# Patient Record
Sex: Male | Born: 1970 | Race: White | Hispanic: No | Marital: Married | Smoking: Never smoker
Health system: Southern US, Community
[De-identification: ages and names within clinical notes are randomized; demographics above are authoritative.]

## PROBLEM LIST (undated history)

## (undated) DIAGNOSIS — E785 Hyperlipidemia, unspecified: Secondary | ICD-10-CM

## (undated) DIAGNOSIS — R3915 Urgency of urination: Secondary | ICD-10-CM

## (undated) DIAGNOSIS — R319 Hematuria, unspecified: Secondary | ICD-10-CM

## (undated) DIAGNOSIS — J302 Other seasonal allergic rhinitis: Secondary | ICD-10-CM

## (undated) DIAGNOSIS — L237 Allergic contact dermatitis due to plants, except food: Secondary | ICD-10-CM

## (undated) DIAGNOSIS — R011 Cardiac murmur, unspecified: Secondary | ICD-10-CM

## (undated) DIAGNOSIS — N201 Calculus of ureter: Secondary | ICD-10-CM

## (undated) DIAGNOSIS — Z87442 Personal history of urinary calculi: Secondary | ICD-10-CM

## (undated) DIAGNOSIS — J45909 Unspecified asthma, uncomplicated: Secondary | ICD-10-CM

## (undated) HISTORY — DX: Hyperlipidemia, unspecified: E78.5

## (undated) HISTORY — DX: Cardiac murmur, unspecified: R01.1

## (undated) HISTORY — DX: Unspecified asthma, uncomplicated: J45.909

## (undated) HISTORY — PX: LAPAROSCOPIC APPENDECTOMY: SUR753

---

## 2010-12-04 HISTORY — PX: APPENDECTOMY: SHX54

## 2011-06-08 ENCOUNTER — Emergency Department (HOSPITAL_COMMUNITY): Payer: BC Managed Care – PPO

## 2011-06-08 ENCOUNTER — Inpatient Hospital Stay (HOSPITAL_COMMUNITY)
Admission: EM | Admit: 2011-06-08 | Discharge: 2011-06-11 | DRG: 580 | Disposition: A | Discharge status: Home / Self Care | Payer: BC Managed Care – PPO | Attending: Surgery | Admitting: Surgery

## 2011-06-08 DIAGNOSIS — K651 Peritoneal abscess: Secondary | ICD-10-CM | POA: Diagnosis present

## 2011-06-08 DIAGNOSIS — T8140XA Infection following a procedure, unspecified, initial encounter: Principal | ICD-10-CM | POA: Diagnosis present

## 2011-06-08 DIAGNOSIS — E7800 Pure hypercholesterolemia, unspecified: Secondary | ICD-10-CM | POA: Diagnosis present

## 2011-06-08 DIAGNOSIS — Y836 Removal of other organ (partial) (total) as the cause of abnormal reaction of the patient, or of later complication, without mention of misadventure at the time of the procedure: Secondary | ICD-10-CM | POA: Diagnosis present

## 2011-06-08 DIAGNOSIS — Z87442 Personal history of urinary calculi: Secondary | ICD-10-CM

## 2011-06-08 LAB — DIFFERENTIAL
Basophils Relative: 0 % (ref 0–1)
Eosinophils Absolute: 0.1 10*3/uL (ref 0.0–0.7)
Monocytes Relative: 8 % (ref 3–12)
Neutrophils Relative %: 83 % — ABNORMAL HIGH (ref 43–77)

## 2011-06-08 LAB — URINALYSIS, ROUTINE W REFLEX MICROSCOPIC
Glucose, UA: NEGATIVE mg/dL
Leukocytes, UA: NEGATIVE
Nitrite: NEGATIVE
Protein, ur: NEGATIVE mg/dL
Urobilinogen, UA: 0.2 mg/dL (ref 0.0–1.0)

## 2011-06-08 MED ORDER — IOHEXOL 300 MG/ML  SOLN
100.0000 mL | Freq: Once | INTRAMUSCULAR | Status: AC | PRN
  Administered 2011-06-08: 100 mL via INTRAVENOUS

## 2011-06-09 ENCOUNTER — Inpatient Hospital Stay (HOSPITAL_COMMUNITY): Payer: BC Managed Care – PPO

## 2011-06-09 LAB — APTT: aPTT: 33 seconds (ref 24–37)

## 2011-06-09 LAB — CBC
MCH: 29 pg (ref 26.0–34.0)
Platelets: 402 10*3/uL — ABNORMAL HIGH (ref 150–400)
Platelets: 332 10*3/uL (ref 150–400)
RBC: 4.35 MIL/uL (ref 4.22–5.81)
RDW: 12.1 % (ref 11.5–15.5)
WBC: 14.7 10*3/uL — ABNORMAL HIGH (ref 4.0–10.5)

## 2011-06-09 LAB — BASIC METABOLIC PANEL
CO2: 26 mEq/L (ref 19–32)
Calcium: 8.9 mg/dL (ref 8.4–10.5)
GFR calc Af Amer: >60 mL/min (ref >60)
GFR calc non Af Amer: >60 mL/min (ref >60)
Sodium: 137 mEq/L (ref 135–145)

## 2011-06-09 LAB — PROTIME-INR
INR: 1.11 (ref 0.00–1.49)
Prothrombin Time: 14.5 seconds (ref 11.6–15.2)

## 2011-06-10 LAB — CBC
MCH: 28.4 pg (ref 26.0–34.0)
MCHC: 33.9 g/dL (ref 30.0–36.0)
RDW: 12 % (ref 11.5–15.5)

## 2011-06-13 LAB — CULTURE, ROUTINE-ABSCESS

## 2011-06-13 NOTE — H&P (Signed)
NAME:  Stephen Dalton, Stephen Dalton NO.:  [id number]  MEDICAL RECORD NO.:  [id number]  LOCATION:  WLED                         FACILITY:  [facility]  PHYSICIAN:  Adolph Pollack, M.D.DATE OF BIRTH:  [birth year]  DATE OF ADMISSION:  06/08/2011 DATE OF DISCHARGE:                             HISTORY & PHYSICAL   REASON FOR ADMISSION:  Post-appendectomy abscesses.  HISTORY:  This is a 40 year old male who recently moved here from [place].  On May 29, 2011, he underwent a laparoscopic appendectomy for what he describes as a gangrenous appendicitis.  He was released the next day.  On his 3rd postoperative day, he began having some fever and malaise.  This progressed through the weekend.  The malaise has progressively worsened along with the persistent fever and he has some right lower quadrant pain.  He presented to the emergency department tonight and was noted to have a leukocytosis.  The CT scan demonstrated multiple abscesses in the intra-abdominal cavity and I was asked to see him for that.  PAST MEDICAL HISTORY: 1. Hypercholesterolemia. 2. Nephrolithiasis.  PREVIOUS OPERATIONS:  Laparoscopic appendectomy.  ALLERGIES:  LATEX.  MEDICATIONS:  Simvastatin.  SOCIAL HISTORY:  He is married with 1 child.  No tobacco or alcohol use.  FAMILY HISTORY:  Noncontributory.  REVIEW OF SYSTEMS:  CARDIAC:  No heart disease, hypertension. PULMONARY:  No asthma, pneumonia, or emphysema.  GI:  No peptic ulcer disease, hepatitis.  ENDOCRINE:  No diabetes.  HEMATOLOGIC:  No bleeding disorders or blood clots.  NEUROLOGIC:  No strokes or seizures.  PHYSICAL EXAMINATION:  GENERAL:  A well-developed, well-nourished male in no acute distress, pleasant, and cooperative. VITAL SIGNS:  Temperature is 100.5, blood pressure is 112/71, pulse 72, respiratory rate 18, O2 sats 95% room air.  NECK:  Supple without masses. HEENT:  Normocephalic, atraumatic. RESPIRATORY:  Breath sounds equal and  clear. RESPIRATIONS:  Unlabored. CARDIOVASCULAR:  Regular rate, regular rhythm.  No murmur.  No JVD. ABDOMEN:  Soft.  There is a small subumbilical scar and 2 small scars in the left lower abdominal area, all clean, dry, and intact.  There is some moderate right lower quadrant tenderness with mild guarding. Hypoactive bowel sounds are noted.  No hernias. MUSCULOSKELETAL:  No edema.  Good range of motion. SKIN:  No jaundice.  LABORATORY DATA:  White cell count 19,900.  Hemoglobin 12.6.  Platelet count 402,000.  Electrolytes within normal limits except for glucose of 102.  Urinalysis negative.  RADIOLOGIC DATA:  CT scan demonstrates some inflammatory changes near the terminal ileum and cecum.  There is about a 2 cm fluid collection in the right lower quadrant and a 2.6 cm fluid collection in the right lower quadrant, which may be connected.  There is a 2.9 cm fluid collection in the left pelvis.  All these are suspicious for abscesses.  IMPRESSION:  Post appendectomy intra-abdominal abscesses.  PLAN:  We will admit to the hospital.  Start broad-spectrum IV antibiotics.  We will obtain the interventional radiology consult tomorrow for consideration of percutaneous drainage.  This is all have been explained to him and he understands.     Adolph Pollack, M.D.     Kari Baars  D:  06/08/2011  T:  06/09/2011  Job:  [id number]  Electronically Signed by Avel Peace M.D. on 06/13/2011 10:14:17 AM

## 2011-06-14 LAB — ANAEROBIC CULTURE

## 2011-06-16 NOTE — Discharge Summary (Signed)
  NAME:  Stephen, Dalton NO.:  [id number]  MEDICAL RECORD NO.:  [id number]  LOCATION:  1321                         FACILITY:  [facility]  PHYSICIAN:  Maisie Fus A. Lenix Benoist, M.D.DATE OF BIRTH:  1971/01/11  DATE OF ADMISSION:  06/08/2011 DATE OF DISCHARGE:  06/11/2011                              DISCHARGE SUMMARY   ADMITTING DIAGNOSIS:  Pelvic abscess, status post laparoscopic appendectomy done in [place].  DISCHARGE DIAGNOSIS:  Pelvic abscess, status post laparoscopic appendectomy done in [place] .  PROCEDURE PERFORMED:  Percutaneous aspiration of intra-abdominal fluid collection by Interventional Radiology.  BRIEF HISTORY:  The patient is a 40 year old male who on May 28, 2010, underwent a laparoscopic appendectomy in [place].  This was for gangrenous appendicitis.  He was discharged home.  He began to develop abdominal pain, but had to re-locate in this region for work.  He was seen on July 5th by Dr. Abbey Chatters.  CT scan showed multiple small pelvic abscesses, he was admitted.  HOSPITAL COURSE:  He underwent percutaneous aspiration on 06/09/2011. His fever went away.  His white count normalized.  Abdominal pain resolved.  Bowels are moving.  He is tolerating regular diet.  He was switched from Zosyn to Augmentin 875 mg p.o. b.i.d., discharged on June 11, 2011.  At that time, he was tolerating regular diet.  He was having bowel movement since abdominal pain was minimal.  He had no fever, no chills, his white count at discharge 7.  Cultures of the fluid at the time of dictation showed no organisms.  DISCHARGE INSTRUCTIONS:  He will follow up in 10 to 14 days in Dr. Janann August' clinic or with Dr. Jamey Ripa.  He will go home on Augmentin 875 mg p.o. b.i.d. for 10 days.  He may return to work later on in this week if he wishes and I have asked him to follow paperwork with the office.  He may drive at his discretion.  He may resume a regular diet.  CONDITION ON  DISCHARGE:  Improved.     Stevon Gough A. Oland Arquette, M.D.     TAC/MEDQ  D:  06/11/2011  T:  06/12/2011  Job:  [id number]  Electronically Signed by Harriette Bouillon M.D. on 06/16/2011 02:55:54 PM

## 2011-06-27 ENCOUNTER — Encounter (INDEPENDENT_AMBULATORY_CARE_PROVIDER_SITE_OTHER): Payer: Self-pay

## 2011-06-27 ENCOUNTER — Ambulatory Visit (INDEPENDENT_AMBULATORY_CARE_PROVIDER_SITE_OTHER): Payer: BC Managed Care – PPO | Admitting: Radiology

## 2011-06-27 DIAGNOSIS — IMO0002 Reserved for concepts with insufficient information to code with codable children: Secondary | ICD-10-CM

## 2011-06-27 DIAGNOSIS — K37 Unspecified appendicitis: Secondary | ICD-10-CM

## 2011-06-27 DIAGNOSIS — K651 Peritoneal abscess: Secondary | ICD-10-CM

## 2011-06-27 NOTE — Progress Notes (Signed)
History of Present Illness: Stephen Dalton is a  40 y.o. male who presents today status post lap appy. He actually had his appendectomy in [place] end of June. He presented to the hospital with pain and fever and was found thave a pelvic abscess. [facility] admitted him and had his abscess percutaneously drained by IR successfully. He was discharge home on antibiotics and has done very well.  The patient is tolerating a regular diet, having normal bowel movements, has good pain control.  He finished his 10 day course of Augmentin.  He is back to most normal activities.   Physical Exam: Abd: soft, nontender, active bowel sounds, nondistended.  All incisions are well healed.  Impression: 1.  Pelvic abscess post appendectomy done at another facility. Percutaneously drained and resolved after antiobiotic course. Plan: He is able to return to normal activities. He  may follow up on a prn basis.

## 2012-07-18 ENCOUNTER — Emergency Department (HOSPITAL_COMMUNITY)
Admission: EM | Admit: 2012-07-18 | Discharge: 2012-07-18 | Disposition: A | Discharge status: Home / Self Care | Payer: BC Managed Care – PPO | Source: Home / Self Care

## 2012-07-18 ENCOUNTER — Encounter (HOSPITAL_COMMUNITY): Payer: Self-pay | Admitting: *Deleted

## 2012-07-18 DIAGNOSIS — M7040 Prepatellar bursitis, unspecified knee: Secondary | ICD-10-CM

## 2012-07-18 MED ORDER — TRAMADOL HCL 50 MG PO TABS: 50.0000 mg | ORAL_TABLET | Freq: Four times a day (QID) | ORAL | Status: AC | PRN

## 2012-07-18 MED ORDER — DOXYCYCLINE HYCLATE 100 MG PO TABS: 100.0000 mg | ORAL_TABLET | Freq: Two times a day (BID) | ORAL | Status: AC

## 2012-07-18 NOTE — ED Provider Notes (Signed)
Medical screening examination/treatment/procedure(s) were performed by a resident physician and as supervising physician I was immediately available for consultation/collaboration.  Leslee Home, M.D.   Reuben Likes, MD 07/18/12 2217

## 2012-07-18 NOTE — ED Provider Notes (Signed)
Stephen Dalton is a 41 y.o. male who presents to Urgent Care today for left anterior knee pain. Patient fell onto his left knee 5 days ago. He suffered an abrasion.  He notes continued pain especially with flexion of the knee. He denies any fevers or chills trouble walking weakness or numbness. Additionally he denies any knee swelling locking or catching.  He has not tried any made pain medicines yet.  Pain improves with rest.     PMH reviewed. Hyperlipidemia History  Substance Use Topics  . Smoking status: Never Smoker   . Smokeless tobacco: Not on file  . Alcohol Use: Yes   works as a Char[employer] fundraiser. It is from [place]  ROS as above Medications reviewed. No current facility-administered medications for this encounter.   Current Outpatient Prescriptions  Medication Sig Dispense Refill  . SIMVASTATIN PO Take by mouth.        . doxycycline (VIBRA-TABS) 100 MG tablet Take 1 tablet (100 mg total) by mouth 2 (two) times daily.  14 tablet  0  . traMADol (ULTRAM) 50 MG tablet Take 1 tablet (50 mg total) by mouth every 6 (six) hours as needed for pain.  30 tablet  0    Exam:  BP 114/78  Pulse 64  Temp 97.9 F (36.6 C) (Oral)  Resp 16  SpO2 98% Gen: Well NAD LEFT KNEE: Abrasion on the anterior medial aspect of the left knee.  Mild surrounding erythema. Mildly tender.  Mild swelling of the prepatellar area.   Palpable squeak overlying the patellar tendon Nontender over the patella itself.  Range of motion 0-120 Negative Lachman's, McMurray's, valgus and varus tests.  Strength intact. Gait normal.   Assessment and Plan: 41 y.o. male with traumatic prepatellar bursitis.   He may have overlying cellulitis. However I do not feel that his prepatellar bursa itself is infected it is not significantly tender there.   Plan: Oral doxycycline and pain control.  Recommend followup with myself or Dr. Farris Has in one week if not improved.  Discussed warning signs or symptoms. Please see discharge  instructions. Patient expresses understanding.      Rodolph Bong, MD 07/18/12 [phone]

## 2012-07-18 NOTE — ED Notes (Signed)
Pt reports pain in left knee after fall last week.

## 2012-12-09 ENCOUNTER — Ambulatory Visit
Admission: RE | Admit: 2012-12-09 | Discharge: 2012-12-09 | Disposition: A | Discharge status: Home / Self Care | Payer: BC Managed Care – PPO | Source: Ambulatory Visit | Attending: Family Medicine | Admitting: Family Medicine

## 2012-12-09 ENCOUNTER — Other Ambulatory Visit: Payer: Self-pay | Admitting: Family Medicine

## 2012-12-09 DIAGNOSIS — R05 Cough: Secondary | ICD-10-CM

## 2012-12-09 DIAGNOSIS — J45909 Unspecified asthma, uncomplicated: Secondary | ICD-10-CM

## 2015-04-29 ENCOUNTER — Emergency Department (INDEPENDENT_AMBULATORY_CARE_PROVIDER_SITE_OTHER): Payer: BLUE CROSS/BLUE SHIELD

## 2015-04-29 ENCOUNTER — Emergency Department (HOSPITAL_COMMUNITY)
Admission: EM | Admit: 2015-04-29 | Discharge: 2015-04-29 | Disposition: A | Discharge status: Home / Self Care | Payer: BLUE CROSS/BLUE SHIELD | Source: Home / Self Care | Attending: Family Medicine | Admitting: Family Medicine

## 2015-04-29 ENCOUNTER — Encounter (HOSPITAL_COMMUNITY): Payer: Self-pay | Admitting: Emergency Medicine

## 2015-04-29 DIAGNOSIS — J40 Bronchitis, not specified as acute or chronic: Secondary | ICD-10-CM

## 2015-04-29 MED ORDER — PREDNISONE 50 MG PO TABS: 50.0000 mg | ORAL_TABLET | Freq: Every day | ORAL | Status: DC

## 2015-04-29 MED ORDER — IPRATROPIUM-ALBUTEROL 0.5-2.5 (3) MG/3ML IN SOLN
RESPIRATORY_TRACT | Status: AC
  Filled 2015-04-29: qty 3

## 2015-04-29 MED ORDER — GUAIFENESIN-CODEINE 100-10 MG/5ML PO SOLN: 5.0000 mL | Freq: Every evening | ORAL | Status: DC | PRN

## 2015-04-29 MED ORDER — IPRATROPIUM-ALBUTEROL 0.5-2.5 (3) MG/3ML IN SOLN
3.0000 mL | Freq: Once | RESPIRATORY_TRACT | Status: AC
  Administered 2015-04-29: 3 mL via RESPIRATORY_TRACT

## 2015-04-29 NOTE — ED Provider Notes (Signed)
Stephen Dalton is a 44 y.o. male who presents to Urgent Care today for cough fatigue headache shortness of breath present for the last week. This is associated with some wheezing. He notes some fevers. No vomiting or diarrhea. He's used some albuterol which helps a little. Feels well otherwise. No significant chest pain. No leg swelling or recent immobility.   History reviewed. No pertinent past medical history. Past Surgical History  Procedure Laterality Date  . Appendectomy     History  Substance Use Topics  . Smoking status: Never Smoker   . Smokeless tobacco: Not on file  . Alcohol Use: Yes   ROS as above Medications: No current facility-administered medications for this encounter.   Current Outpatient Prescriptions  Medication Sig Dispense Refill  . SIMVASTATIN PO Take by mouth.      Marland Kitchen guaiFENesin-codeine 100-10 MG/5ML syrup Take 5 mLs by mouth at bedtime as needed for cough. 120 mL 0  . predniSONE (DELTASONE) 50 MG tablet Take 1 tablet (50 mg total) by mouth daily. 5 tablet 0   No Known Allergies   Exam:  BP 128/76 mmHg  Pulse 104  Temp(Src) 99.6 F (37.6 C) (Oral)  Resp 20  SpO2 95% Gen: Well NAD HEENT: EOMI,  MMM Lungs: Normal work of breathing. Wheezing present bilaterally Heart: Mild tachycardia no MRG Abd: NABS, Soft. Nondistended, Nontender Exts: Brisk capillary refill, warm and well perfused.   Patient was given a 2.5/0.5 mg DuoNeb nebulizer treatment, and felt better  No results found for this or any previous visit (from the past 24 hour(s)). Dg Chest 2 View  04/29/2015   CLINICAL DATA:  Acute cough and shortness of breath and wheezing.  EXAM: CHEST  2 VIEW  COMPARISON:  12/09/2012  FINDINGS: Normal heart size and vascularity. Mild bronchitic and interstitial prominence, nonspecific but can be seen with bronchitis. No focal pneumonia, collapse or consolidation. No edema, effusion or pneumothorax. Trachea midline.  IMPRESSION: Mild bronchitic changes.  No  focal pneumonia.   Electronically Signed   By: Jerilynn Mages.  Shick M.D.   On: 04/29/2015 21:07    Assessment and Plan: 44 y.o. male with bronchitis. Treat with prednisone, albuterol, and codeine cough syrup. Watchful waiting. Return as needed.  Discussed warning signs or symptoms. Please see discharge instructions. Patient expresses understanding.     Gregor Hams, MD 04/29/15 2115

## 2015-04-29 NOTE — ED Notes (Signed)
C/o  Cough and headache.    Pt triaged and assessed by provider.   Provider in before nurse.

## 2015-04-29 NOTE — Discharge Instructions (Signed)
Thank you for coming in today. °Call or go to the emergency room if you get worse, have trouble breathing, have chest pains, or palpitations.  ° °Acute Bronchitis °Bronchitis is inflammation of the airways that extend from the windpipe into the lungs (bronchi). The inflammation often causes mucus to develop. This leads to a cough, which is the most common symptom of bronchitis.  °In acute bronchitis, the condition usually develops suddenly and goes away over time, usually in a couple weeks. Smoking, allergies, and asthma can make bronchitis worse. Repeated episodes of bronchitis may cause further lung problems.  °CAUSES °Acute bronchitis is most often caused by the same virus that causes a cold. The virus can spread from person to person (contagious) through coughing, sneezing, and touching contaminated objects. °SIGNS AND SYMPTOMS  °· Cough.   °· Fever.   °· Coughing up mucus.   °· Body aches.   °· Chest congestion.   °· Chills.   °· Shortness of breath.   °· Sore throat.   °DIAGNOSIS  °Acute bronchitis is usually diagnosed through a physical exam. Your health care provider will also ask you questions about your medical history. Tests, such as chest X-rays, are sometimes done to rule out other conditions.  °TREATMENT  °Acute bronchitis usually goes away in a couple weeks. Oftentimes, no medical treatment is necessary. Medicines are sometimes given for relief of fever or cough. Antibiotic medicines are usually not needed but may be prescribed in certain situations. In some cases, an inhaler may be recommended to help reduce shortness of breath and control the cough. A cool mist vaporizer may also be used to help thin bronchial secretions and make it easier to clear the chest.  °HOME CARE INSTRUCTIONS °· Get plenty of rest.   °· Drink enough fluids to keep your urine clear or pale yellow (unless you have a medical condition that requires fluid restriction). Increasing fluids may help thin your respiratory secretions  (sputum) and reduce chest congestion, and it will prevent dehydration.   °· Take medicines only as directed by your health care provider. °· If you were prescribed an antibiotic medicine, finish it all even if you start to feel better. °· Avoid smoking and secondhand smoke. Exposure to cigarette smoke or irritating chemicals will make bronchitis worse. If you are a smoker, consider using nicotine gum or skin patches to help control withdrawal symptoms. Quitting smoking will help your lungs heal faster.   °· Reduce the chances of another bout of acute bronchitis by washing your hands frequently, avoiding people with cold symptoms, and trying not to touch your hands to your mouth, nose, or eyes.   °· Keep all follow-up visits as directed by your health care provider.   °SEEK MEDICAL CARE IF: °Your symptoms do not improve after 1 week of treatment.  °SEEK IMMEDIATE MEDICAL CARE IF: °· You develop an increased fever or chills.   °· You have chest pain.   °· You have severe shortness of breath. °· You have bloody sputum.   °· You develop dehydration. °· You faint or repeatedly feel like you are going to pass out. °· You develop repeated vomiting. °· You develop a severe headache. °MAKE SURE YOU:  °· Understand these instructions. °· Will watch your condition. °· Will get help right away if you are not doing well or get worse. °Document Released: 12/28/2004 Document Revised: 04/06/2014 Document Reviewed: 05/13/2013 °[employer]® Patient Information ©2015 [employer]. This information is not intended to replace advice given to you by your health care provider. Make sure you discuss any questions you have with your   health care provider. ° °

## 2016-02-03 ENCOUNTER — Emergency Department (HOSPITAL_COMMUNITY)
Admission: EM | Admit: 2016-02-03 | Discharge: 2016-02-04 | Disposition: A | Discharge status: Home / Self Care | Payer: BLUE CROSS/BLUE SHIELD | Attending: Emergency Medicine | Admitting: Emergency Medicine

## 2016-02-03 ENCOUNTER — Encounter (HOSPITAL_COMMUNITY): Payer: Self-pay | Admitting: Emergency Medicine

## 2016-02-03 DIAGNOSIS — N200 Calculus of kidney: Secondary | ICD-10-CM | POA: Diagnosis not present

## 2016-02-03 DIAGNOSIS — R109 Unspecified abdominal pain: Secondary | ICD-10-CM | POA: Diagnosis present

## 2016-02-03 DIAGNOSIS — Z79899 Other long term (current) drug therapy: Secondary | ICD-10-CM | POA: Diagnosis not present

## 2016-02-03 DIAGNOSIS — Z9049 Acquired absence of other specified parts of digestive tract: Secondary | ICD-10-CM | POA: Insufficient documentation

## 2016-02-03 NOTE — ED Notes (Signed)
Patient presents for right flank pain PTA, denies N/V/D, dysuria, frequency or hematuria.

## 2016-02-04 ENCOUNTER — Emergency Department (HOSPITAL_COMMUNITY): Payer: BLUE CROSS/BLUE SHIELD

## 2016-02-04 LAB — COMPREHENSIVE METABOLIC PANEL
ALK PHOS: 63 U/L (ref 38–126)
ALT: 21 U/L (ref 17–63)
AST: 18 U/L (ref 15–41)
Albumin: 5 g/dL (ref 3.5–5.0)
Anion gap: 12 (ref 5–15)
BILIRUBIN TOTAL: 0.6 mg/dL (ref 0.3–1.2)
BUN: 31 mg/dL — AB (ref 6–20)
CALCIUM: 9.7 mg/dL (ref 8.9–10.3)
CHLORIDE: 105 mmol/L (ref 101–111)
CO2: 23 mmol/L (ref 22–32)
CREATININE: 1.52 mg/dL — AB (ref 0.61–1.24)
GFR calc Af Amer: >60 mL/min (ref >60)
GFR, EST NON AFRICAN AMERICAN: 55 mL/min — AB (ref >60)
Glucose, Bld: 164 mg/dL — ABNORMAL HIGH (ref 65–99)
Potassium: 3.7 mmol/L (ref 3.5–5.1)
Sodium: 140 mmol/L (ref 135–145)
Total Protein: 7.8 g/dL (ref 6.5–8.1)

## 2016-02-04 LAB — CBC WITH DIFFERENTIAL/PLATELET
BASOS ABS: 0 10*3/uL (ref 0.0–0.1)
Basophils Relative: 0 %
Eosinophils Absolute: 0 10*3/uL (ref 0.0–0.7)
Eosinophils Relative: 0 %
HEMATOCRIT: 40.3 % (ref 39.0–52.0)
HEMOGLOBIN: 14 g/dL (ref 13.0–17.0)
LYMPHS ABS: 1.5 10*3/uL (ref 0.7–4.0)
LYMPHS PCT: 10 %
MCH: 29.7 pg (ref 26.0–34.0)
MCHC: 34.7 g/dL (ref 30.0–36.0)
MCV: 85.6 fL (ref 78.0–100.0)
Monocytes Absolute: 0.9 10*3/uL (ref 0.1–1.0)
Monocytes Relative: 6 %
NEUTROS ABS: 12.3 10*3/uL — AB (ref 1.7–7.7)
NEUTROS PCT: 84 %
PLATELETS: 278 10*3/uL (ref 150–400)
RBC: 4.71 MIL/uL (ref 4.22–5.81)
RDW: 12.8 % (ref 11.5–15.5)
WBC: 14.7 10*3/uL — AB (ref 4.0–10.5)

## 2016-02-04 LAB — URINALYSIS, ROUTINE W REFLEX MICROSCOPIC
Bilirubin Urine: NEGATIVE
Glucose, UA: NEGATIVE mg/dL
Hgb urine dipstick: NEGATIVE
KETONES UR: NEGATIVE mg/dL
LEUKOCYTES UA: NEGATIVE
NITRITE: NEGATIVE
PH: 5.5 (ref 5.0–8.0)
Protein, ur: NEGATIVE mg/dL
Specific Gravity, Urine: 1.029 (ref 1.005–1.030)

## 2016-02-04 MED ORDER — MORPHINE SULFATE (PF) 4 MG/ML IV SOLN
4.0000 mg | Freq: Once | INTRAVENOUS | Status: AC
  Administered 2016-02-04: 4 mg via INTRAVENOUS
  Filled 2016-02-04: qty 1

## 2016-02-04 MED ORDER — TAMSULOSIN HCL 0.4 MG PO CAPS: 0.4000 mg | ORAL_CAPSULE | Freq: Every day | ORAL | Status: DC

## 2016-02-04 MED ORDER — SODIUM CHLORIDE 0.9 % IV BOLUS (SEPSIS)
1000.0000 mL | Freq: Once | INTRAVENOUS | Status: AC
  Administered 2016-02-04: 1000 mL via INTRAVENOUS

## 2016-02-04 MED ORDER — KETOROLAC TROMETHAMINE 30 MG/ML IJ SOLN
30.0000 mg | Freq: Once | INTRAMUSCULAR | Status: AC
  Administered 2016-02-04: 30 mg via INTRAVENOUS
  Filled 2016-02-04: qty 1

## 2016-02-04 MED ORDER — ONDANSETRON 4 MG PO TBDP: 4.0000 mg | ORAL_TABLET | Freq: Three times a day (TID) | ORAL | Status: DC | PRN

## 2016-02-04 MED ORDER — IBUPROFEN 800 MG PO TABS: 800.0000 mg | ORAL_TABLET | Freq: Three times a day (TID) | ORAL | Status: DC | PRN

## 2016-02-04 MED ORDER — ONDANSETRON HCL 4 MG/2ML IJ SOLN
4.0000 mg | Freq: Once | INTRAMUSCULAR | Status: AC
  Administered 2016-02-04: 4 mg via INTRAVENOUS
  Filled 2016-02-04: qty 2

## 2016-02-04 MED ORDER — OXYCODONE-ACETAMINOPHEN 5-325 MG PO TABS: 1.0000 | ORAL_TABLET | Freq: Four times a day (QID) | ORAL | Status: DC | PRN

## 2016-02-04 NOTE — ED Notes (Signed)
Patient d/c'd self care with wife driving home.  F/U and medications reviewed.  Patient verbalized understanding.

## 2016-02-04 NOTE — ED Provider Notes (Signed)
By signing my name below, I, Altamease Oiler, attest that this documentation has been prepared under the direction and in the presence of [place], DO. Electronically Signed: Altamease Oiler, ED Scribe. 02/04/2016. 1:21 AM.  TIME SEEN: 1:13 AM  CHIEF COMPLAINT: Right flank pain  HPI: Stephen Dalton is a 45 y.o. male with history of kidney stones who presents to the Emergency Department complaining of constant, 8/10 in severity, right flank pain with onset 3 hours ago. The pain radiates to the lower abdomen and is similar to pain that he had in the past with kidney stones. He has the urge to urinate but states that he cannot.  Pt denies testicular pain or swelling, penile discharge, nausea, vomiting, diarrhea, hematuria, dysuria, fever, and chills. NKDA. States his last kidney stone was in 2011 in [place]. He does not have a urologist here. He has never had to have surgery for his kidney stones. He is status post appendectomy.  ROS: See HPI Constitutional: no fever  Eyes: no drainage  ENT: no runny nose   Cardiovascular:  no chest pain  Resp: no SOB  GI: no vomiting GU: no dysuria Integumentary: no rash  Allergy: no hives  Musculoskeletal: no leg swelling  Neurological: no slurred speech ROS otherwise negative  PAST MEDICAL HISTORY/PAST SURGICAL HISTORY:  History reviewed. No pertinent past medical history.  MEDICATIONS:  Prior to Admission medications   Medication Sig Start Date End Date Taking? Authorizing Provider  SIMVASTATIN PO Take 5 mg by mouth daily.    Yes Historical Provider, MD  guaiFENesin-codeine 100-10 MG/5ML syrup Take 5 mLs by mouth at bedtime as needed for cough. Patient not taking: Reported on 02/03/2016 04/29/15   Gregor Hams, MD  predniSONE (DELTASONE) 50 MG tablet Take 1 tablet (50 mg total) by mouth daily. Patient not taking: Reported on 02/03/2016 04/29/15   Gregor Hams, MD    ALLERGIES:  Allergies  Allergen Reactions  . Latex Rash    SOCIAL  HISTORY:  Social History  Substance Use Topics  . Smoking status: Never Smoker   . Smokeless tobacco: Not on file  . Alcohol Use: Yes    FAMILY HISTORY: Family History  Problem Relation Age of Onset  . Heart attack Mother   . Cancer Father     EXAM: BP 127/88 mmHg  Pulse 60  Temp(Src) 98.6 F (37 C) (Oral)  Resp 22  SpO2 100% CONSTITUTIONAL: Alert and oriented and responds appropriately to questions. Appears uncomfortable; well-nourished HEAD: Normocephalic EYES: Conjunctivae clear, PERRL ENT: normal nose; no rhinorrhea; moist mucous membranes; pharynx without lesions noted NECK: Supple, no meningismus, no LAD  CARD: RRR; S1 and S2 appreciated; no murmurs, no clicks, no rubs, no gallops RESP: Normal chest excursion without splinting or tachypnea; breath sounds clear and equal bilaterally; no wheezes, no rhonchi, no rales, no hypoxia or respiratory distress, speaking full sentences ABD/GI: Normal bowel sounds; non-distended; soft, TTP in the RLQ, no rebound, no guarding, no peritoneal signs GU:  Normal external genitalia, circumcised male, normal penile shaft, no blood or discharge at the urethral meatus, no testicular masses or tenderness on exam, no scrotal masses or swelling, no hernias appreciated, 2+ femoral pulses bilaterally; no perineal erythema, warmth, subcutaneous air or crepitus; no high riding testicle, normal bilateral cremasteric reflex BACK:  The back appears normal and is non-tender to palpation, there is no CVA tenderness EXT: Normal ROM in all joints; non-tender to palpation; no edema; normal capillary refill; no cyanosis, no calf tenderness or  swelling    SKIN: Normal color for age and race; warm NEURO: Moves all extremities equally, sensation to light touch intact diffusely, cranial nerves II through XII intact PSYCH: The patient's mood and manner are appropriate. Grooming and personal hygiene are appropriate.  MEDICAL DECISION MAKING: Patient here with right  flank pain that radiates into the right lower quadrant. Suspect kidney stone. Urine shows no blood or sign of infection. Hemodynamically stable. He is status post appendectomy. We'll give IV fluids, Toradol, morphine and Zofran. We'll obtain labs and CT of his abdomen and pelvis.  ED PROGRESS: Patient's labs show mild elevation of his creatinine at 1.52. He has received IV fluids. He does have a leukocytosis with left shift but no sign of urinary tract infection. CT scan shows a 3 mm distal right ureteral stone with mild right hydronephrosis. There is also 7 mm nonobstructing left renal calculus. Patient reports feeling much better. I feel he can be discharged home with urine strainer, Percocet, ibuprofen, Zofran and Flomax. Given outpatient urology follow-up information. Discussed return precautions. He verbalizes understanding and is comfortable with this plan.       I personally performed the services described in this documentation, which was scribed in my presence. The recorded information has been reviewed and is accurate.      [place], DO 02/04/16 [phone]

## 2016-02-04 NOTE — Discharge Instructions (Signed)
Dietary Guidelines to Help Prevent Kidney Stones °Your risk of kidney stones can be decreased by adjusting the foods you eat. The most important thing you can do is drink enough fluid. You should drink enough fluid to keep your urine clear or pale yellow. The following guidelines provide specific information for the type of kidney stone you have had. °GUIDELINES ACCORDING TO TYPE OF KIDNEY STONE °Calcium Oxalate Kidney Stones °· Reduce the amount of salt you eat. Foods that have a lot of salt cause your body to release excess calcium into your urine. The excess calcium can combine with a substance called oxalate to form kidney stones. °· Reduce the amount of animal protein you eat if the amount you eat is excessive. Animal protein causes your body to release excess calcium into your urine. Ask your dietitian how much protein from animal sources you should be eating. °· Avoid foods that are high in oxalates. If you take vitamins, they should have less than 500 mg of vitamin C. Your body turns vitamin C into oxalates. You do not need to avoid fruits and vegetables high in vitamin C. °Calcium Phosphate Kidney Stones °· Reduce the amount of salt you eat to help prevent the release of excess calcium into your urine. °· Reduce the amount of animal protein you eat if the amount you eat is excessive. Animal protein causes your body to release excess calcium into your urine. Ask your dietitian how much protein from animal sources you should be eating. °· Get enough calcium from food or take a calcium supplement (ask your dietitian for recommendations). Food sources of calcium that do not increase your risk of kidney stones include: °· Broccoli. °· Dairy products, such as cheese and yogurt. °· Pudding. °Uric Acid Kidney Stones °· Do not have more than 6 oz of animal protein per day. °FOOD SOURCES °Animal Protein Sources °· Meat (all types). °· Poultry. °· Eggs. °· Fish, seafood. °Foods High in Salt °· Salt seasonings. °· Soy  sauce. °· Teriyaki sauce. °· Cured and processed meats. °· Salted crackers and snack foods. °· Fast food. °· Canned soups and most canned foods. °Foods High in Oxalates °· Grains: °· Amaranth. °· Barley. °· Grits. °· Wheat germ. °· Bran. °· Buckwheat flour. °· All bran cereals. °· Pretzels. °· Whole wheat bread. °· Vegetables: °· Beans (wax). °· Beets and beet greens. °· Collard greens. °· Eggplant. °· Escarole. °· Leeks. °· Okra. °· Parsley. °· Rutabagas. °· Spinach. °· Swiss chard. °· Tomato paste. °· Fried potatoes. °· Sweet potatoes. °· Fruits: °· Red currants. °· Figs. °· Kiwi. °· Rhubarb. °· Meat and Other Protein Sources: °· Beans (dried). °· Soy burgers and other soybean products. °· Miso. °· Nuts (peanuts, almonds, pecans, cashews, hazelnuts). °· Nut butters. °· Sesame seeds and tahini (paste made of sesame seeds). °· Poppy seeds. °· Beverages: °· Chocolate drink mixes. °· Soy milk. °· Instant iced tea. °· Juices made from high-oxalate fruits or vegetables. °· Other: °· Carob. °· Chocolate. °· Fruitcake. °· Marmalades. °  °This information is not intended to replace advice given to you by your health care provider. Make sure you discuss any questions you have with your health care provider. °  °Document Released: 03/17/2011 Document Revised: 11/25/2013 Document Reviewed: 10/17/2013 °Elsevier Interactive Patient Education ©2016 [employer] ° °Kidney Stones °Kidney stones (urolithiasis) are deposits that form inside your kidneys. The intense pain is caused by the stone moving through the urinary tract. When the stone moves, the ureter   goes into spasm around the stone. The stone is usually passed in the urine.  °CAUSES  °· A disorder that makes certain neck glands produce too much parathyroid hormone (primary hyperparathyroidism). °· A buildup of uric acid crystals, similar to gout in your joints. °· Narrowing (stricture) of the ureter. °· A kidney obstruction present at birth (congenital  obstruction). °· Previous surgery on the kidney or ureters. °· Numerous kidney infections. °SYMPTOMS  °· Feeling sick to your stomach (nauseous). °· Throwing up (vomiting). °· Blood in the urine (hematuria). °· Pain that usually spreads (radiates) to the groin. °· Frequency or urgency of urination. °DIAGNOSIS  °· Taking a history and physical exam. °· Blood or urine tests. °· CT scan. °· Occasionally, an examination of the inside of the urinary bladder (cystoscopy) is performed. °TREATMENT  °· Observation. °· Increasing your fluid intake. °· Extracorporeal shock wave lithotripsy--This is a noninvasive procedure that uses shock waves to break up kidney stones. °· Surgery may be needed if you have severe pain or persistent obstruction. There are various surgical procedures. Most of the procedures are performed with the use of small instruments. Only small incisions are needed to accommodate these instruments, so recovery time is minimized. °The size, location, and chemical composition are all important variables that will determine the proper choice of action for you. Talk to your health care provider to better understand your situation so that you will minimize the risk of injury to yourself and your kidney.  °HOME CARE INSTRUCTIONS  °· Drink enough water and fluids to keep your urine clear or pale yellow. This will help you to pass the stone or stone fragments. °· Strain all urine through the provided strainer. Keep all particulate matter and stones for your health care provider to see. The stone causing the pain may be as small as a grain of salt. It is very important to use the strainer each and every time you pass your urine. The collection of your stone will allow your health care provider to analyze it and verify that a stone has actually passed. The stone analysis will often identify what you can do to reduce the incidence of recurrences. °· Only take over-the-counter or prescription medicines for pain,  discomfort, or fever as directed by your health care provider. °· Keep all follow-up visits as told by your health care provider. This is important. °· Get follow-up X-rays if required. The absence of pain does not always mean that the stone has passed. It may have only stopped moving. If the urine remains completely obstructed, it can cause loss of kidney function or even complete destruction of the kidney. It is your responsibility to make sure X-rays and follow-ups are completed. Ultrasounds of the kidney can show blockages and the status of the kidney. Ultrasounds are not associated with any radiation and can be performed easily in a matter of minutes. °· Make changes to your daily diet as told by your health care provider. You may be told to: °¨ Limit the amount of salt that you eat. °¨ Eat 5 or more servings of fruits and vegetables each day. °¨ Limit the amount of meat, poultry, fish, and eggs that you eat. °· Collect a 24-hour urine sample as told by your health care provider. You may need to collect another urine sample every 6-12 months. °SEEK MEDICAL CARE IF: °· You experience pain that is progressive and unresponsive to any pain medicine you have been prescribed. °SEEK IMMEDIATE MEDICAL CARE IF:  °· Pain   Pain cannot be controlled with the prescribed medicine. °· You have a fever or shaking chills. °· The severity or intensity of pain increases over 18 hours and is not relieved by pain medicine. °· You develop a new onset of abdominal pain. °· You feel faint or pass out. °· You are unable to urinate. °  °This information is not intended to replace advice given to you by your health care provider. Make sure you discuss any questions you have with your health care provider. °  °Document Released: 11/20/2005 Document Revised: 08/11/2015 Document Reviewed: 04/23/2013 °Elsevier Interactive Patient Education ©2016 [employer] ° °

## 2016-02-04 NOTE — ED Notes (Signed)
Patient will be d/c'd once ride arrives.

## 2016-02-04 NOTE — ED Notes (Signed)
MD at bedside. 

## 2016-06-07 IMAGING — CT CT RENAL STONE PROTOCOL
2 of 3 series · 16 of 35 positions shown, 18 images · non-contrast
Comparison: CT dated 06/09/2011 and 06/08/2011

CLINICAL DATA: 44-year-old male with right lower quadrant abdominal
pain.

EXAM:
CT ABDOMEN AND PELVIS WITHOUT CONTRAST
TECHNIQUE: Multidetector CT imaging of the abdomen and pelvis was performed
following the standard protocol without IV contrast.

[Series 4: lung · axial · 0.82mm/px · z∈[+1380,+1470]mm · 13 of 21 slices shown, 15 images]
[im 2/21  soft-tissue]
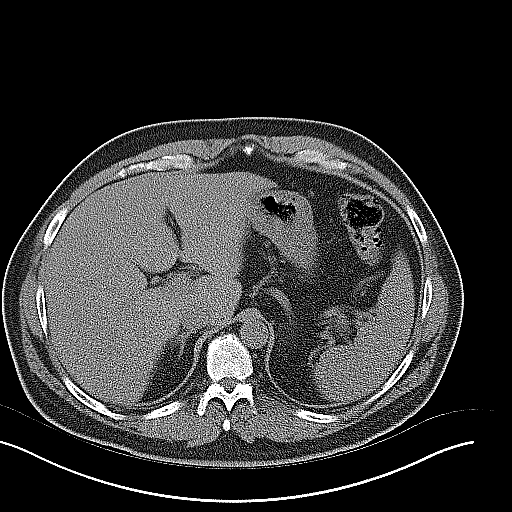
[im 2/21  bone]
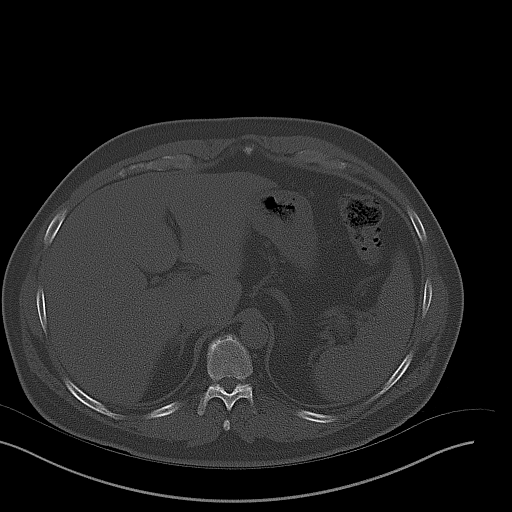
[im 4/21  soft-tissue]
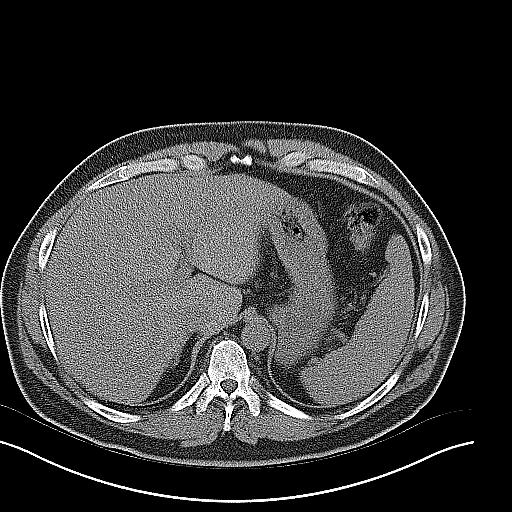
[im 5/21  soft-tissue]
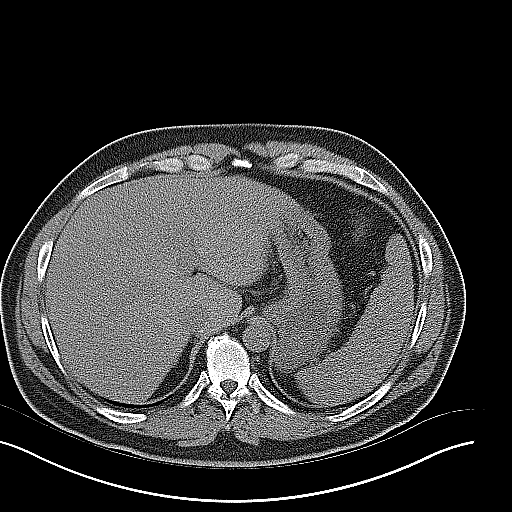
[im 7/21  soft-tissue]
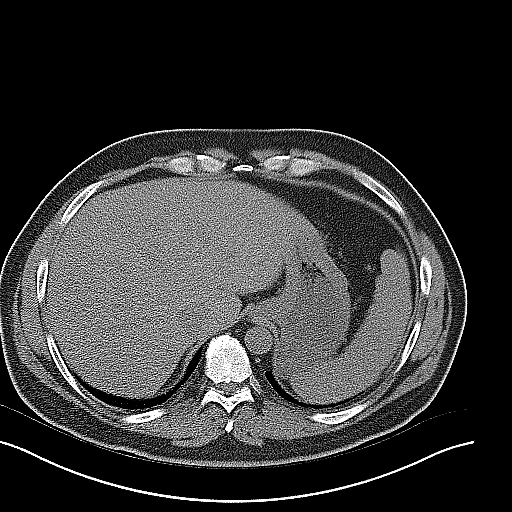
[im 8/21  soft-tissue]
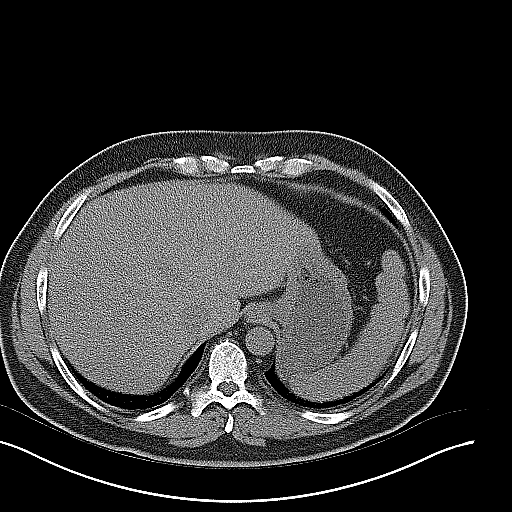
[im 10/21  soft-tissue]
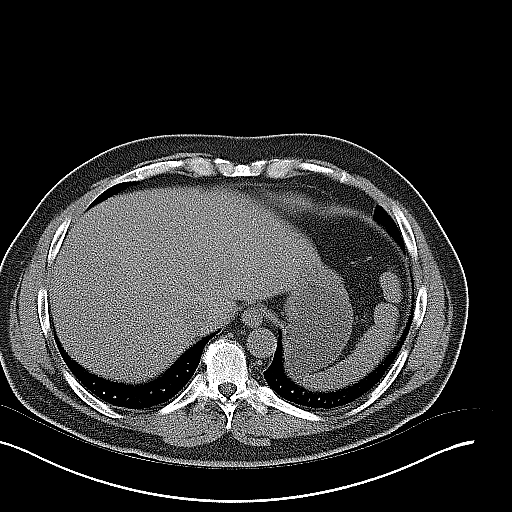
[im 11/21  soft-tissue]
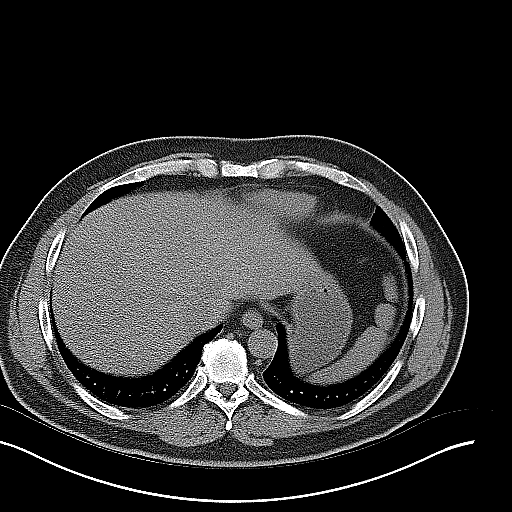
[im 12/21  soft-tissue]
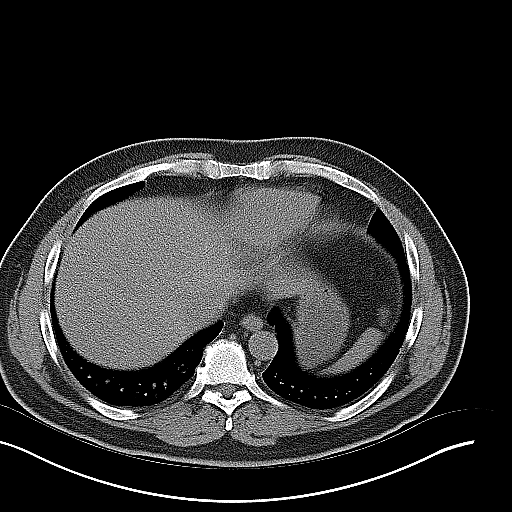
[im 14/21  soft-tissue]
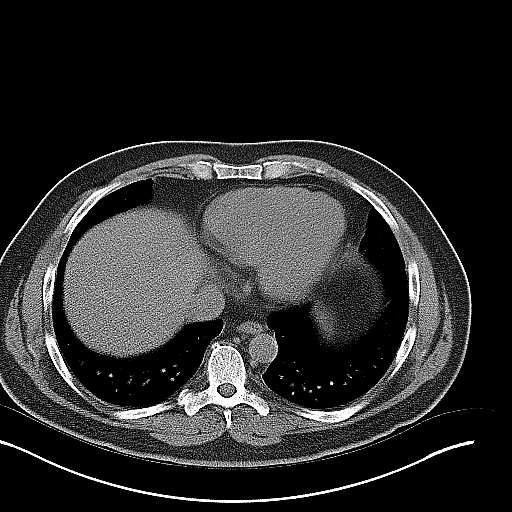
[im 14/21  bone]
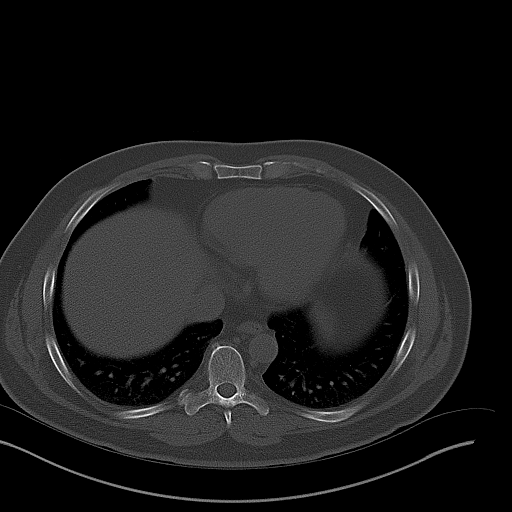
[im 15/21  soft-tissue]
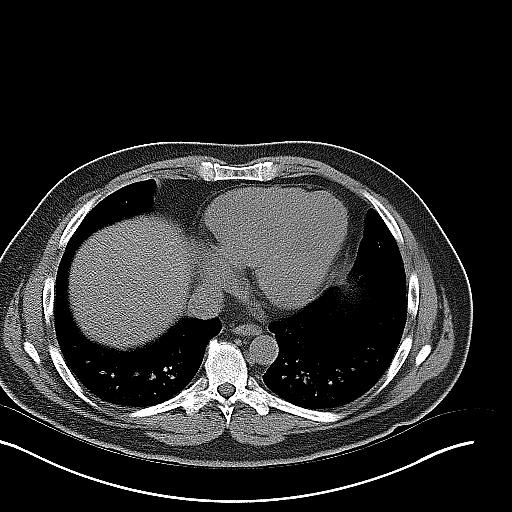
[im 17/21  soft-tissue]
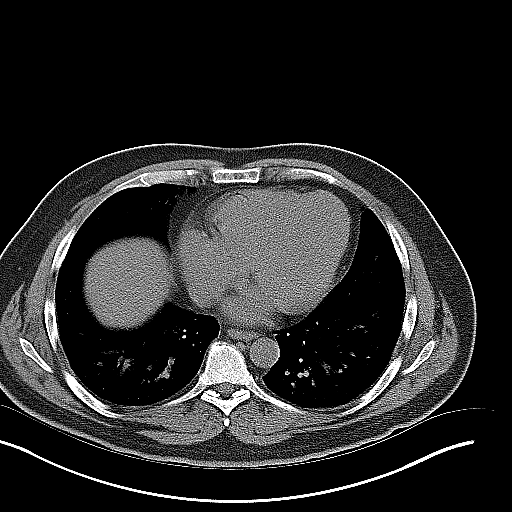
[im 18/21  soft-tissue]
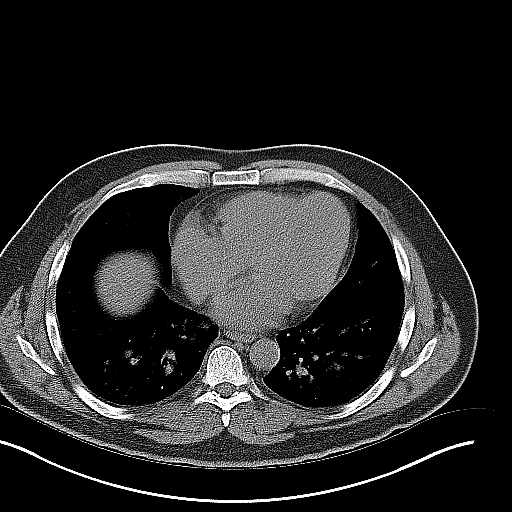
[im 20/21  soft-tissue]
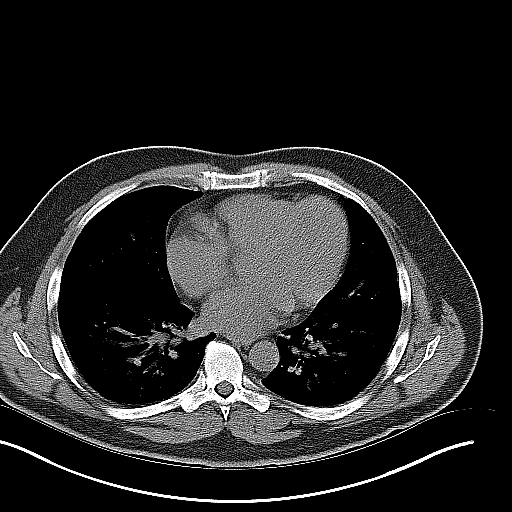

[Series 5: coronal · coronal · 0.74mm/px · 3 of 121 slices shown]
[im 41/121  soft-tissue]
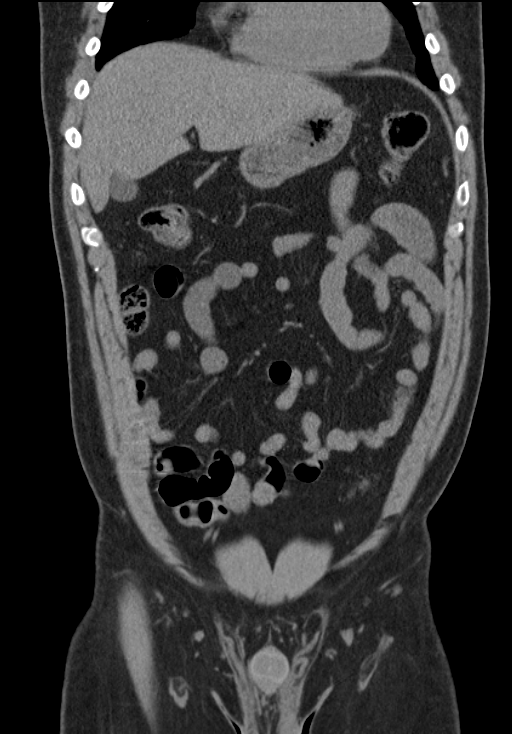
[im 54/121  soft-tissue]
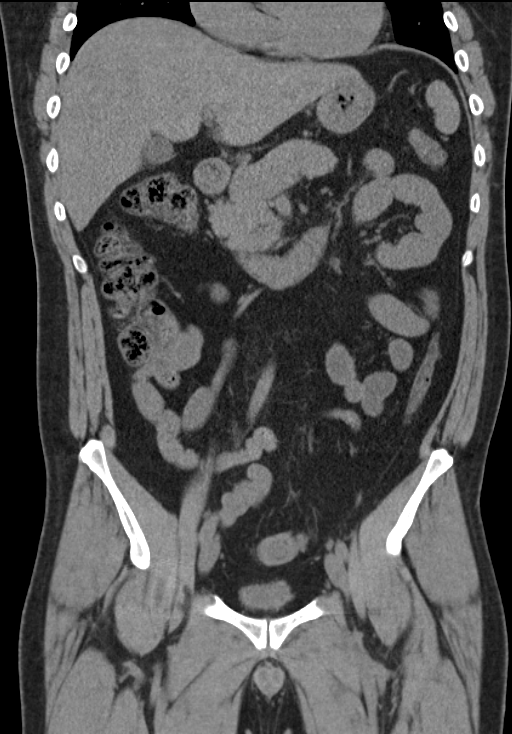
[im 67/121  soft-tissue]
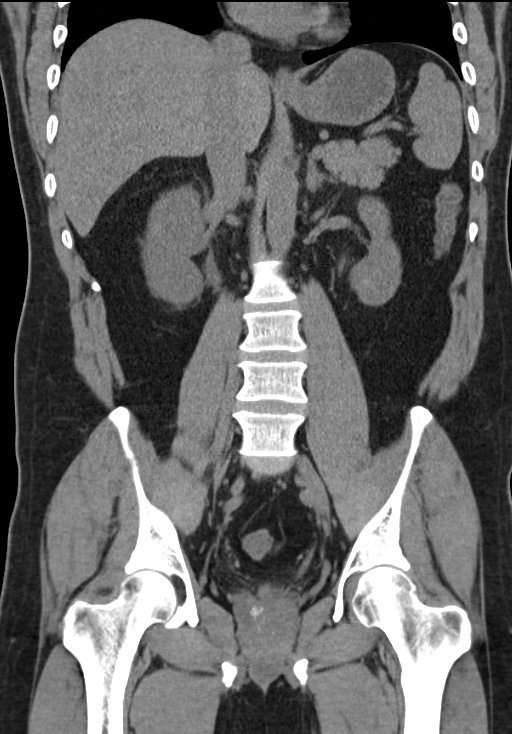

[16 of 35 positions shown; findings below may reference images not displayed]

FINDINGS: Evaluation of this exam is limited in the absence of intravenous
contrast.

The visualized lung bases are clear. No intra-abdominal free air or
free fluid.

The liver, gallbladder, pancreas, spleen, adrenal glands appear
unremarkable. There is a 7 mm nonobstructing stone in the upper pole
of the left kidney. No hydronephrosis. There is a 3 mm distal right
ureteral calculus with mild right hydronephrosis. Correlation with
urinalysis recommended to exclude superimposed UTI. The urinary
bladder is collapsed. The prostate and seminal vesicles are grossly
unremarkable.

No evidence of bowel obstruction or inflammation.  Appendectomy.

The abdominal aorta and IVC appear grossly unremarkable on this
noncontrast study. No portal venous gas identified. There is no
adenopathy. Small fat containing umbilical hernia. The abdominal
wall soft tissues appear unremarkable. The osseous structures are
intact.
IMPRESSION: A 3 mm distal right ureteral stone with mild right hydronephrosis.

A 7 mm nonobstructing left renal calculus. No hydronephrosis on the
left.

## 2016-07-21 ENCOUNTER — Encounter: Payer: Self-pay | Admitting: Physician Assistant

## 2016-07-24 ENCOUNTER — Encounter: Payer: Self-pay | Admitting: Gastroenterology

## 2016-07-27 ENCOUNTER — Ambulatory Visit (INDEPENDENT_AMBULATORY_CARE_PROVIDER_SITE_OTHER): Payer: BLUE CROSS/BLUE SHIELD | Admitting: Physician Assistant

## 2016-07-27 ENCOUNTER — Encounter (INDEPENDENT_AMBULATORY_CARE_PROVIDER_SITE_OTHER): Payer: Self-pay

## 2016-07-27 ENCOUNTER — Encounter: Payer: Self-pay | Admitting: Physician Assistant

## 2016-07-27 VITALS — BP 120/90 | HR 64 | Ht 71.0 in | Wt 211.2 lb

## 2016-07-27 DIAGNOSIS — K649 Unspecified hemorrhoids: Secondary | ICD-10-CM

## 2016-07-27 DIAGNOSIS — K921 Melena: Secondary | ICD-10-CM | POA: Diagnosis not present

## 2016-07-27 DIAGNOSIS — R194 Change in bowel habit: Secondary | ICD-10-CM

## 2016-07-27 DIAGNOSIS — R1032 Left lower quadrant pain: Secondary | ICD-10-CM

## 2016-07-27 MED ORDER — HYDROCORTISONE ACETATE 25 MG RE SUPP: 25.0000 mg | Freq: Two times a day (BID) | RECTAL | 1 refills | Status: DC

## 2016-07-27 NOTE — Progress Notes (Signed)
Agree with assessment and plan. While hemorrhoids are the likely cause, given the patient's age and these symptoms I think a colonoscopy is reasonable at this time to ensure no bleeding polyp / mass lesion if you can assist with having him scheduled. Thanks

## 2016-07-27 NOTE — Patient Instructions (Signed)
We sent a prescription to [address] for the Anusol HC Suppositories. Use 1 twice daily for 7 days, we did give a refill.   We have provided you with a High Fiber Handout.

## 2016-07-27 NOTE — Progress Notes (Signed)
Chief Complaint: Rectal bleeding and change in bowel habits  HPI:  Stephen Dalton is a 45 y/o Caucasian male who was referred to me by Tera Helper, NP for a complaint of rectal bleeding and change in bowel habits .    Today, the patient presents to clinic and explains that for the past "month or more", he has noticed a change in his bowel habits. He describes that he has started to experience a left lower quadrant pressure/discomfort before a bowel movement which is relieved afterwards. He has also noticed that he has been straining more in order to pass a stool. He typically has 2 solid bowel movements in the morning, which have become somewhat softer recently. Patient also notes 2-3 instances of bright red blood on the toilet paper and/or in the toilet bowl. After seeing blood the started to get concerned and then proceeded to his primary care provider as above. Patient does note that he does have some rectal itchiness and discomfort. He did try hydrocortisone cream for this area, with no relief.  Patient does admit to an inadequate water intake on a daily basis which he has been trying to improve recently.  Patient's social history is positive for being from [place]  Patient denies fever, chills, change in diet, recent addition of any medications, recent antibiotics, weight loss, fatigue, anorexia, nausea, vomiting, heartburn, reflux or symptoms that awaken him at night.   Past Medical History:  Diagnosis Date  . Asthma   . HLD (hyperlipidemia)   . Kidney stones    x2    Past Surgical History:  Procedure Laterality Date  . APPENDECTOMY  2012    Current Outpatient Prescriptions  Medication Sig Dispense Refill  . SIMVASTATIN PO Take 5 mg by mouth daily.      No current facility-administered medications for this visit.     Allergies as of 07/27/2016 - Review Complete 07/27/2016  Allergen Reaction Noted  . Latex Rash 02/03/2016    Family History  Problem Relation Age of  Onset  . Heart attack Mother   . Throat cancer Father     Social History   Social History  . Marital status: Married    Spouse name: N/A  . Number of children: N/A  . Years of education: N/A   Occupational History  . Not on file.   Social History Main Topics  . Smoking status: Never Smoker  . Smokeless tobacco: Never Used  . Alcohol use No  . Drug use: No  . Sexual activity: Not on file   Other Topics Concern  . Not on file   Social History Narrative  . No narrative on file    Review of Systems:    Constitutional: No weight loss, fever, chills, weakness or fatigue HEENT: Eyes: No Change in vision               Ears, Nose, Throat:  No change in hearing or congestion Skin: No rash or itching Cardiovascular: No chest pain, chest pressure or palpitations      Respiratory: No SOB or cough Gastrointestinal: See HPI and otherwise negative Genitourinary: No dysuria or change in urinary frequency Neurological: No headache, dizziness or syncope Musculoskeletal: No new muscle or back pain Hematologic: See history of present illness No bruising Psychiatric: No history of depression or anxiety   Physical Exam:  Vital signs: BP 120/90 (BP Location: Left Arm, Patient Position: Sitting, Cuff Size: Normal)   Pulse 64   Ht 5\' 11"  (1.803  m)   Wt 211 lb 3.2 oz (95.8 kg)   BMI 29.46 kg/m   General:   Pleasant Caucasian male appears to be in NAD, Well developed, Well nourished, alert and cooperative Head:  Normocephalic and atraumatic. Eyes:   PEERL, EOMI. No icterus. Conjunctiva pink. Ears:  Normal auditory acuity. Neck:  Supple Throat: Oral cavity and pharynx without inflammation, swelling or lesion. Teeth in good condition. Lungs: Respirations even and unlabored. Lungs clear to auscultation bilaterally.   No wheezes, crackles, or rhonchi.  Heart: Normal S1, S2. No MRG. Regular rate and rhythm. No peripheral edema, cyanosis or pallor.  Abdomen:  Soft, nondistended, nontender.  No rebound or guarding. Normal bowel sounds. No appreciable masses or hepatomegaly. Rectal:  External: No hemorrhoids or fissure identified, internal: Rectal fullness and some pressure with palpation, no residue  Msk:  Symmetrical without gross deformities. Peripheral pulses intact.  Extremities:  Without edema, no deformity or joint abnormality. Normal ROM Neurologic:  Alert and  oriented x4;  grossly normal neurologically. Skin:   Dry and intact without significant lesions or rashes. Psychiatric: Oriented to person, place and time. Demonstrates good judgement and reason without abnormal affect or behaviors.  RELEVANT LABS AND IMAGING: No recent labs or imaging  Assessment: 1. Change in bowel habits: Patient describes recent increase in straining and abdominal pain prior to a bowel movement, relieved afterwards, this has changed over the past few months, also with 2-3 episodes of a small amount of bright red blood; likely this represents constipation with corresponding hemorrhoids and some bleeding, though if no improvement from conservative measures, cannot rule out colon cancer without colonoscopy 2. Left lower quadrant abdominal discomfort: Likely due to constipation 3. Hematochezia: 2-3 episodes over the past few months, small amounts of bright red blood, none in the past 2 weeks, likely related to above 4. Hemorrhoids: Appreciated at time of rectal exam today  Plan: 1. Recommend the patient do a trial of conservative measures. Discussed that he should increase his fiber intake to at least 25-35 g per day with the use of fiber supplement such as Metamucil, Citrucel or Benefiber. 2. Recommend the patient increase his water intake to at least 6-8 8 ounce glasses of water per day 3. Recommend the patient maintain adequate exercise to help avoid constipation 4. Prescribe Anusol suppositories 25 mg, twice a day 7 days with one refill 5. If patient continues with rectal itching he can try  Preparation H cream over-the-counter 6. Did discuss with the patient that if he has an increase or worsening of symptoms over the next 2 weeks, he can call our clinic and we will schedule him for a colonoscopy. This would be with Dr. Havery Moros as the patient is establishing care here today and he is the supervising physician.  Ellouise Newer, PA-C [facility] 07/27/2016, 1:06 PM  Cc: Tera Helper, NP

## 2016-07-28 ENCOUNTER — Telehealth: Payer: Self-pay | Admitting: Physician Assistant

## 2016-07-28 NOTE — Telephone Encounter (Signed)
Called pharmacy and spoke to Rodman Key, Software engineer.  The patient said his insurance wont cover the suppositories.  I had him change it to the cream, 30 grams with 1 refill.

## 2017-03-09 ENCOUNTER — Ambulatory Visit (HOSPITAL_BASED_OUTPATIENT_CLINIC_OR_DEPARTMENT_OTHER): Payer: BLUE CROSS/BLUE SHIELD | Admitting: Anesthesiology

## 2017-03-09 ENCOUNTER — Other Ambulatory Visit: Payer: Self-pay | Admitting: Urology

## 2017-03-09 ENCOUNTER — Encounter (HOSPITAL_BASED_OUTPATIENT_CLINIC_OR_DEPARTMENT_OTHER)
Admission: RE | Disposition: A | Discharge status: Home / Self Care | Payer: Self-pay | Source: Ambulatory Visit | Attending: Urology

## 2017-03-09 ENCOUNTER — Ambulatory Visit (HOSPITAL_BASED_OUTPATIENT_CLINIC_OR_DEPARTMENT_OTHER)
Admission: RE | Admit: 2017-03-09 | Discharge: 2017-03-09 | Disposition: A | Discharge status: Home / Self Care | Payer: BLUE CROSS/BLUE SHIELD | Source: Ambulatory Visit | Attending: Urology | Admitting: Urology

## 2017-03-09 ENCOUNTER — Encounter (HOSPITAL_BASED_OUTPATIENT_CLINIC_OR_DEPARTMENT_OTHER): Payer: Self-pay | Admitting: *Deleted

## 2017-03-09 DIAGNOSIS — N200 Calculus of kidney: Secondary | ICD-10-CM | POA: Diagnosis not present

## 2017-03-09 DIAGNOSIS — J45909 Unspecified asthma, uncomplicated: Secondary | ICD-10-CM | POA: Diagnosis not present

## 2017-03-09 DIAGNOSIS — Z79899 Other long term (current) drug therapy: Secondary | ICD-10-CM | POA: Insufficient documentation

## 2017-03-09 DIAGNOSIS — Z87442 Personal history of urinary calculi: Secondary | ICD-10-CM | POA: Diagnosis not present

## 2017-03-09 DIAGNOSIS — E785 Hyperlipidemia, unspecified: Secondary | ICD-10-CM | POA: Diagnosis not present

## 2017-03-09 HISTORY — DX: Hematuria, unspecified: R31.9

## 2017-03-09 HISTORY — DX: Urgency of urination: R39.15

## 2017-03-09 HISTORY — DX: Personal history of urinary calculi: Z87.442

## 2017-03-09 HISTORY — PX: CYSTOSCOPY WITH RETROGRADE PYELOGRAM, URETEROSCOPY AND STENT PLACEMENT: SHX5789

## 2017-03-09 HISTORY — DX: Other seasonal allergic rhinitis: J30.2

## 2017-03-09 HISTORY — DX: Calculus of ureter: N20.1

## 2017-03-09 HISTORY — DX: Allergic contact dermatitis due to plants, except food: L23.7

## 2017-03-09 LAB — POCT HEMOGLOBIN-HEMACUE: Hemoglobin: 14.2 g/dL (ref 13.0–17.0)

## 2017-03-09 SURGERY — CYSTOURETEROSCOPY, WITH RETROGRADE PYELOGRAM AND STENT INSERTION
Anesthesia: General | Laterality: Left

## 2017-03-09 MED ORDER — MIDAZOLAM HCL 2 MG/2ML IJ SOLN
INTRAMUSCULAR | Status: AC
  Filled 2017-03-09: qty 2

## 2017-03-09 MED ORDER — FENTANYL CITRATE (PF) 100 MCG/2ML IJ SOLN
INTRAMUSCULAR | Status: DC | PRN
Start: 2017-03-09 — End: 2017-03-09
  Administered 2017-03-09 (×2): 50 ug via INTRAVENOUS

## 2017-03-09 MED ORDER — PHENAZOPYRIDINE HCL 200 MG PO TABS: 200.0000 mg | ORAL_TABLET | Freq: Three times a day (TID) | ORAL | 0 refills | Status: AC | PRN

## 2017-03-09 MED ORDER — DEXAMETHASONE SODIUM PHOSPHATE 10 MG/ML IJ SOLN
INTRAMUSCULAR | Status: DC | PRN
  Administered 2017-03-09: 10 mg via INTRAVENOUS

## 2017-03-09 MED ORDER — ONDANSETRON HCL 4 MG/2ML IJ SOLN
INTRAMUSCULAR | Status: DC | PRN
  Administered 2017-03-09: 4 mg via INTRAVENOUS

## 2017-03-09 MED ORDER — CEFAZOLIN SODIUM-DEXTROSE 2-4 GM/100ML-% IV SOLN
2.0000 g | INTRAVENOUS | Status: AC
  Administered 2017-03-09: 2 g via INTRAVENOUS
  Filled 2017-03-09: qty 100

## 2017-03-09 MED ORDER — IOHEXOL 300 MG/ML  SOLN
INTRAMUSCULAR | Status: DC | PRN
Start: 2017-03-09 — End: 2017-03-09
  Administered 2017-03-09: 4 mL via INTRAVENOUS

## 2017-03-09 MED ORDER — METOCLOPRAMIDE HCL 5 MG/ML IJ SOLN
INTRAMUSCULAR | Status: DC | PRN
  Administered 2017-03-09: 10 mg via INTRAVENOUS

## 2017-03-09 MED ORDER — WHITE PETROLATUM GEL
Status: AC
  Filled 2017-03-09: qty 5

## 2017-03-09 MED ORDER — PROPOFOL 10 MG/ML IV BOLUS
INTRAVENOUS | Status: AC
  Filled 2017-03-09: qty 20

## 2017-03-09 MED ORDER — MIDAZOLAM HCL 5 MG/5ML IJ SOLN
INTRAMUSCULAR | Status: DC | PRN
  Administered 2017-03-09: 2 mg via INTRAVENOUS

## 2017-03-09 MED ORDER — KETOROLAC TROMETHAMINE 15 MG/ML IJ SOLN
30.0000 mg | Freq: Four times a day (QID) | INTRAMUSCULAR | Status: DC | PRN
  Administered 2017-03-09: 30 mg via INTRAVENOUS
  Filled 2017-03-09: qty 2

## 2017-03-09 MED ORDER — FENTANYL CITRATE (PF) 100 MCG/2ML IJ SOLN
INTRAMUSCULAR | Status: AC
  Filled 2017-03-09: qty 2

## 2017-03-09 MED ORDER — OXYCODONE-ACETAMINOPHEN 5-325 MG PO TABS: 1.0000 | ORAL_TABLET | ORAL | 0 refills | Status: DC | PRN

## 2017-03-09 MED ORDER — DEXAMETHASONE SODIUM PHOSPHATE 10 MG/ML IJ SOLN
INTRAMUSCULAR | Status: AC
  Filled 2017-03-09: qty 1

## 2017-03-09 MED ORDER — EPHEDRINE 5 MG/ML INJ
INTRAVENOUS | Status: AC
  Filled 2017-03-09: qty 10

## 2017-03-09 MED ORDER — LACTATED RINGERS IV SOLN
INTRAVENOUS | Status: DC
  Administered 2017-03-09: 13:00:00 via INTRAVENOUS
  Filled 2017-03-09: qty 1000

## 2017-03-09 MED ORDER — ONDANSETRON HCL 4 MG/2ML IJ SOLN
INTRAMUSCULAR | Status: AC
  Filled 2017-03-09: qty 2

## 2017-03-09 MED ORDER — LIDOCAINE 2% (20 MG/ML) 5 ML SYRINGE
INTRAMUSCULAR | Status: DC | PRN
  Administered 2017-03-09: 100 mg via INTRAVENOUS

## 2017-03-09 MED ORDER — PROPOFOL 10 MG/ML IV BOLUS
INTRAVENOUS | Status: DC | PRN
  Administered 2017-03-09: 200 mg via INTRAVENOUS

## 2017-03-09 SURGICAL SUPPLY — 35 items
BAG DRAIN URO-CYSTO SKYTR STRL (DRAIN) ×3 IMPLANT
BASKET LASER NITINOL 1.9FR (BASKET) IMPLANT
BASKET ZERO TIP NITINOL 2.4FR (BASKET) IMPLANT
CATH URET 5FR 28IN CONE TIP (BALLOONS)
CATH URET 5FR 28IN OPEN ENDED (CATHETERS) IMPLANT
CATH URET 5FR 70CM CONE TIP (BALLOONS) IMPLANT
CLOTH BEACON ORANGE TIMEOUT ST (SAFETY) ×3 IMPLANT
ELECT REM PT RETURN 9FT ADLT (ELECTROSURGICAL)
ELECTRODE REM PT RTRN 9FT ADLT (ELECTROSURGICAL) IMPLANT
EXTRACTOR STONE NITINOL NGAGE (UROLOGICAL SUPPLIES) ×3 IMPLANT
FIBER LASER FLEXIVA 1000 (UROLOGICAL SUPPLIES) IMPLANT
FIBER LASER FLEXIVA 365 (UROLOGICAL SUPPLIES) IMPLANT
FIBER LASER FLEXIVA 550 (UROLOGICAL SUPPLIES) IMPLANT
FIBER LASER TRAC TIP (UROLOGICAL SUPPLIES) ×3 IMPLANT
GLOVE SURG SS PI 8.0 STRL IVOR (GLOVE) ×3 IMPLANT
GOWN STRL REUS W/ TWL LRG LVL3 (GOWN DISPOSABLE) IMPLANT
GOWN STRL REUS W/ TWL XL LVL3 (GOWN DISPOSABLE) ×1 IMPLANT
GOWN STRL REUS W/TWL LRG LVL3 (GOWN DISPOSABLE)
GOWN STRL REUS W/TWL XL LVL3 (GOWN DISPOSABLE) ×2
GUIDEWIRE 0.038 PTFE COATED (WIRE) IMPLANT
GUIDEWIRE ANG ZIPWIRE 038X150 (WIRE) IMPLANT
GUIDEWIRE STR DUAL SENSOR (WIRE) ×3 IMPLANT
IV NS IRRIG 3000ML ARTHROMATIC (IV SOLUTION) ×3 IMPLANT
KIT BALLIN UROMAX 15FX10 (LABEL) IMPLANT
KIT BALLN UROMAX 15FX4 (MISCELLANEOUS) IMPLANT
KIT BALLN UROMAX 26 75X4 (MISCELLANEOUS)
KIT RM TURNOVER CYSTO AR (KITS) ×3 IMPLANT
MANIFOLD NEPTUNE II (INSTRUMENTS) IMPLANT
PACK CYSTO (CUSTOM PROCEDURE TRAY) ×3 IMPLANT
SET HIGH PRES BAL DIL (LABEL)
SHEATH ACCESS URETERAL 38CM (SHEATH) IMPLANT
SHEATH ACCESS URETERAL 54CM (SHEATH) ×3 IMPLANT
STENT URET 6FRX26 CONTOUR (STENTS) ×3 IMPLANT
TUBE CONNECTING 12'X1/4 (SUCTIONS)
TUBE CONNECTING 12X1/4 (SUCTIONS) IMPLANT

## 2017-03-09 NOTE — H&P (Signed)
CC: I have kidney stones.  HPI: Stephen Dalton is a 46 year-old male established patient who is here for renal calculi.  Acute renal colic beginning this morning radiating from flank to the left lower abdomen. Also associated with nausea. Patient has a known left upper pole calculi last evaluated with imaging in September 2017. Denies any significant bothersome lower urinary tract symptoms. He is afebrile.   The problem is on the left side. He first stated noticing pain on 03/09/2017. This is not his first kidney stone. He is currently having flank pain and nausea. He denies having back pain, groin pain, vomiting, fever, and chills. He has not caught a stone in his urine strainer since his symptoms began.     ALLERGIES: Latex    MEDICATIONS: Simvastatin 80 mg tablet Oral     GU PSH: None     PSH Notes: Appendectomy   NON-GU PSH: Appendectomy - 02/10/2016    GU PMH: Personal Hx urinary calculi - 08/16/2016 Calculus Ureter, Ureteral calculus, right - 02/10/2016 Kidney Stone, Renal calculus, left - 02/10/2016      PMH Notes: Calculus disease: He was seen in the ER on 02/04/16 A CT scan was obtained which revealed no right renal calculi, a 7 mm stone in the upper pole of the left kidney with Hounsfield units of 900 and a 3 mm stone in the distal right ureter which he passed.   NON-GU PMH: Encounter for general adult medical examination without abnormal findings, Encounter for preventive health examination - 02/10/2016 Personal history of other diseases of the circulatory system, History of cardiac murmur - 02/10/2016 Personal history of other diseases of the respiratory system, History of asthma - 02/10/2016 Personal history of other endocrine, nutritional and metabolic disease, History of hyperlipidemia - 02/10/2016    FAMILY HISTORY: Myocardial Infarction - Runs In Family   SOCIAL HISTORY: Marital Status: Married Current Smoking Status: Patient has never smoked.  Light Drinker.  Drinks 3  caffeinated drinks per day.     Notes: Occupation, Alcohol use, Married, Number of children, Never a smoker, Caffeine use   REVIEW OF SYSTEMS:    GU Review Male:   Patient denies frequent urination, hard to postpone urination, burning/ pain with urination, get up at night to urinate, leakage of urine, stream starts and stops, trouble starting your stream, have to strain to urinate , erection problems, and penile pain.  Gastrointestinal (Upper):   Patient reports nausea. Patient denies vomiting and indigestion/ heartburn.  Gastrointestinal (Lower):   Patient denies diarrhea and constipation.  Constitutional:   Patient denies fever, night sweats, weight loss, and fatigue.  Skin:   Patient denies skin rash/ lesion and itching.  Eyes:   Patient denies blurred vision and double vision.  Ears/ Nose/ Throat:   Patient denies sore throat and sinus problems.  Hematologic/Lymphatic:   Patient denies swollen glands and easy bruising.  Cardiovascular:   Patient denies leg swelling and chest pains.  Respiratory:   Patient denies cough and shortness of breath.  Endocrine:   Patient denies excessive thirst.  Musculoskeletal:   Patient reports back pain. Patient denies joint pain.  Neurological:   Patient denies dizziness and headaches.  Psychologic:   Patient denies depression and anxiety.   VITAL SIGNS:      03/09/2017 09:50 AM  Weight 200 lb / 90.72 kg  Height 71 in / 180.34 cm  BP 156/107 mmHg  Pulse 47 /min  Temperature 97.7 F / 36 C  BMI 27.9 kg/m  MULTI-SYSTEM PHYSICAL EXAMINATION:    Constitutional: Well-nourished. No physical deformities. Normally developed. Good grooming.  Neck: Neck symmetrical, not swollen. Normal tracheal position.  Respiratory: No labored breathing, no use of accessory muscles. CTA  Cardiovascular: Normal temperature, normal extremity pulses, no swelling, no varicosities. RRR w/o murmur  Lymphatic: No enlargement of neck, axillae, groin.  Skin: No paleness, no  jaundice, no cyanosis. No lesion, no ulcer, no rash.  Neurologic / Psychiatric: Oriented to time, oriented to place, oriented to person. No depression, no anxiety, no agitation.  Gastrointestinal: No mass, no tenderness, no rigidity, non obese abdomen. Left flank tenderness.  Eyes: Normal conjunctivae. Normal eyelids.  Ears, Nose, Mouth, and Throat: Left ear no scars, no lesions, no masses. Right ear no scars, no lesions, no masses. Nose no scars, no lesions, no masses. Normal hearing. Normal lips.  Musculoskeletal: Normal gait and station of head and neck.     PAST DATA REVIEWED:  Source Of History:  Patient   PROCEDURES:         C.T. Urogram - [id number]  Left Kidney/Ureter:  5-7 mm UPJ stone. Moderate ureteral dilation. Normal left kidney. Minimal hydro but situation is evolving.               Urinalysis w/Scope Dipstick Dipstick Cont'd Micro  Color: Amber Bilirubin: Neg WBC/hpf: NS (Not Seen)  Appearance: Cloudy Ketones: Neg RBC/hpf: >60/hpf  Specific Gravity: 1.025 Blood: 3+ Bacteria: Few (10-25/hpf)  pH: 6.0 Protein: Trace Cystals: Amorph Urates  Glucose: Neg Urobilinogen: 0.2 Casts: NS (Not Seen)    Nitrites: Neg Trichomonas: Not Present    Leukocyte Esterase: Neg Mucous: Present      Epithelial Cells: NS (Not Seen)      Yeast: NS (Not Seen)      Sperm: Not Present         Ketoralac 60mg  - [id number], [id number] Skin was prepped with alcohol. Site was injected. Pt tolerated well.   Qty: 60 Adm. By: Providence Lanius  Unit: mg Lot No [id number]  Route: IM Exp. Date 08/04/2018  Freq: None Mfgr.:   Site: Left Buttock         Morphine 4mg  - J2270, [id number] Qty: 4 Adm. By: Providence Lanius  Unit: mg Lot No [id number]  Route: IM Exp. Date 06/03/2018  Freq: None Mfgr.:   Site: Left Buttock         Phenergan 25mg  - [id number], J2550 Qty: 25 Adm. By: Providence Lanius  Unit: mg Lot No [id number]  Route: IM Exp. Date 03/04/2018  Freq: None Mfgr.:   Site: Right Buttock   ASSESSMENT:      ICD-10 Details  1  GU:   Kidney Stone - N20.0 Left, lt prox ureter  2   Renal Colic - D22 Acute   PLAN:            Medications New Meds: Tamsulosin Hcl 0.4 mg capsule, ext release 24 hr 1 capsule PO Daily   #30  3 Refill(s)  Ondansetron Odt 4 mg tablet,disintegrating 1 odt PO q 6-8 prn for nausea   #30  1 Refill(s)  Oxycodone Hcl 10 mg tablet 1/2-1 tablet PO q4-6h prn   #20  0 Refill(s)            Orders Labs Urine Culture  X-Rays: C.T. Stone Protocol Without Contrast  X-Ray Notes: History:  Hematuria: Yes/No  Patient to see MD after exam: Yes/No  Previous exam: CT / IVP/ US/ KUB/ None  When:  Where:  Diabetic: Yes/ No  BUN/ Creatinine:  Date of last BUN Creatinine:  Weight in pounds:  Allergy- IV Contrast: Yes/ No  Conflicting diabetic meds: Yes/ No  Diabetic Meds:  Prior Authorization #: [id number]           Schedule Return Visit/Planned Activity: ASAP - Schedule Surgery  Procedure: 03/09/2017 at [facility] - [phone] - Morphine 4mg  (Ther/Proph/Diag Inj, [place]/Im) - 28118, A6773  Procedure: 03/09/2017 at [facility] - [place] 60mg  (Toradol Per 15 Mg) - [id number], [id number]  Procedure: 03/09/2017 at [facility] - [place] 25mg  (Phenergan Per 50 Mg) - P3668, [id number]          Document Letter(s):  Created for Patient: Clinical Summary         Notes:   Left proximal ureteral calculi confirmed on CT. I discussed with the on-call urologist. Patient failed 2 attempts at pain management with morphine and Toradol. He still having intermittent nausea and severe renal colic. Plan is to take patient for ureteroscopy tonight or if unable to visualize stone, a ureteral stent will be placed with plan for repeat ureteroscopy at a later date or lithotripsy.  The procedure as well as risks and complications were discussed with the patient. These include but are not limited to infection, injury to the urinary tract i.e. ureteral  disruption/evulsion, bleeding, stent discomfort, anesthetic complications, among others.

## 2017-03-09 NOTE — Anesthesia Procedure Notes (Signed)
Procedure Name: LMA Insertion Date/Time: 03/09/2017 1:58 PM Performed by: Lyndle Herrlich Pre-anesthesia Checklist: Patient identified, Emergency Drugs available, Suction available and Patient being monitored Patient Re-evaluated:Patient Re-evaluated prior to inductionOxygen Delivery Method: Circle system utilized Preoxygenation: Pre-oxygenation with 100% oxygen Intubation Type: IV induction Ventilation: Mask ventilation without difficulty LMA: LMA inserted LMA Size: 4.0 Number of attempts: 1 Airway Equipment and Method: Bite block Placement Confirmation: positive ETCO2 Tube secured with: Tape Dental Injury: Teeth and Oropharynx as per pre-operative assessment

## 2017-03-09 NOTE — Discharge Instructions (Signed)
Post Anesthesia Home Care Instructions  Activity: Get plenty of rest for the remainder of the day. A responsible individual must stay with you for 24 hours following the procedure.  For the next 24 hours, DO NOT: -Drive a car -Paediatric nurse -Drink alcoholic beverages -Take any medication unless instructed by your physician -Make any legal decisions or sign important papers.  Meals: Start with liquid foods such as gelatin or soup. Progress to regular foods as tolerated. Avoid greasy, spicy, heavy foods. If nausea and/or vomiting occur, drink only clear liquids until the nausea and/or vomiting subsides. Call your physician if vomiting continues.  Special Instructions/Symptoms: Your throat may feel dry or sore from the anesthesia or the breathing tube placed in your throat during surgery. If this causes discomfort, gargle with warm salt water. The discomfort should disappear within 24 hours.  If you had a scopolamine patch placed behind your ear for the management of post- operative nausea and/or vomiting:  1. The medication in the patch is effective for 72 hours, after which it should be removed.  Wrap patch in a tissue and discard in the trash. Wash hands thoroughly with soap and water. 2. You may remove the patch earlier than 72 hours if you experience unpleasant side effects which may include dry mouth, dizziness or visual disturbances. 3. Avoid touching the patch. Wash your hands with soap and water after contact with the patch.   Ureteral Stent Implantation, Care After Refer to this sheet in the next few weeks. These instructions provide you with information about caring for yourself after your procedure. Your health care provider may also give you more specific instructions. Your treatment has been planned according to current medical practices, but problems sometimes occur. Call your health care provider if you have any problems or questions after your procedure. What can I expect  after the procedure? After the procedure, it is common to have:  Nausea.  Mild pain when you urinate. You may feel this pain in your lower back or lower abdomen. Pain should stop within a few minutes after you urinate. This may last for up to 1 week.  A small amount of blood in your urine for several days. Follow these instructions at home:   Medicines   Take over-the-counter and prescription medicines only as told by your health care provider.  If you were prescribed an antibiotic medicine, take it as told by your health care provider. Do not stop taking the antibiotic even if you start to feel better.  Do not drive for 24 hours if you received a sedative.  Do not drive or operate heavy machinery while taking prescription pain medicines. Activity   Return to your normal activities as told by your health care provider. Ask your health care provider what activities are safe for you.  Do not lift anything that is heavier than 10 lb (4.5 kg). Follow this limit for 1 week after your procedure, or for as long as told by your health care provider. General instructions   Watch for any blood in your urine. Call your health care provider if the amount of blood in your urine increases.  If you have a catheter:  Follow instructions from your health care provider about taking care of your catheter and collection bag.  Do not take baths, swim, or use a hot tub until your health care provider approves.  Drink enough fluid to keep your urine clear or pale yellow.  Keep all follow-up visits as told by your  health care provider. This is important. Contact a health care provider if:  You have pain that gets worse or does not get better with medicine, especially pain when you urinate.  You have difficulty urinating.  You feel nauseous or you vomit repeatedly during a period of more than 2 days after the procedure. Get help right away if:  Your urine is dark red or has blood clots in  it.  You are leaking urine (have incontinence).  The end of the stent comes out of your urethra.  You cannot urinate.  You have sudden, sharp, or severe pain in your abdomen or lower back.  You have a fever.  We will need to remove this in the office in a week.  This information is not intended to replace advice given to you by your health care provider. Make sure you discuss any questions you have with your health care provider. Document Released: 07/23/2013 Document Revised: 04/27/2016 Document Reviewed: 06/04/2015 Elsevier Interactive Patient Education  2017 [employer].

## 2017-03-09 NOTE — Anesthesia Preprocedure Evaluation (Signed)
Anesthesia Evaluation  Patient identified by MRN, date of birth, ID band Patient awake    Reviewed: Allergy & Precautions, H&P , Patient's Chart, lab work & pertinent test results, reviewed documented beta blocker date and time   Airway Mallampati: II  TM Distance: >3 FB Neck ROM: full    Dental no notable dental hx.    Pulmonary asthma ,    Pulmonary exam normal breath sounds clear to auscultation       Cardiovascular  Rhythm:regular Rate:Normal     Neuro/Psych    GI/Hepatic   Endo/Other    Renal/GU      Musculoskeletal   Abdominal   Peds  Hematology   Anesthesia Other Findings   Reproductive/Obstetrics                             Anesthesia Physical Anesthesia Plan  ASA: II  Anesthesia Plan: General   Post-op Pain Management:    Induction: Intravenous  Airway Management Planned: LMA  Additional Equipment:   Intra-op Plan:   Post-operative Plan:   Informed Consent: I have reviewed the patients History and Physical, chart, labs and discussed the procedure including the risks, benefits and alternatives for the proposed anesthesia with the patient or authorized representative who has indicated his/her understanding and acceptance.   Dental Advisory Given  Plan Discussed with: CRNA and Surgeon  Anesthesia Plan Comments: ( )        Anesthesia Quick Evaluation

## 2017-03-09 NOTE — Brief Op Note (Signed)
03/09/2017  4:05 PM  PATIENT:  Stephen Dalton  46 y.o. male  PRE-OPERATIVE DIAGNOSIS:  left ureteral stone  POST-OPERATIVE DIAGNOSIS: left renal stone  PROCEDURE:  Procedure(s): CYSTOSCOPY WITH LEFT RETROGRADE PYELOGRAM, LEFT URETEROSCOPY, HOLMIUM LASER AND STENT PLACEMENT (Left)  SURGEON:  Surgeon(s) and Role:    * Irine Seal, MD - Primary  PHYSICIAN ASSISTANT:   ASSISTANTS: none   ANESTHESIA:   general  EBL:  Total I/O In: 1300 [P.O.:200; I.V.:1100] Out: 155 [Urine:150; Blood:5]  BLOOD ADMINISTERED:none  DRAINS: 6 x 26 left JJ stent without string   LOCAL MEDICATIONS USED:  NONE  SPECIMEN:  Source of Specimen:  stone fragments  DISPOSITION OF SPECIMEN:  to patient to bring to office.  COUNTS:  YES  TOURNIQUET:  * No tourniquets in log *  DICTATION: .Other Dictation: Dictation Number [id number]  PLAN OF CARE: Discharge to home after PACU  PATIENT DISPOSITION:  PACU - hemodynamically stable.   Delay start of Pharmacological VTE agent (>24hrs) due to surgical blood loss or risk of bleeding: not applicable

## 2017-03-09 NOTE — Transfer of Care (Signed)
  Last Vitals:  Vitals:   03/09/17 1247  BP: (!) 129/91  Pulse: 65  Resp: 16  Temp: 36.6 C    Last Pain:  Vitals:   03/09/17 1313  TempSrc:   PainSc: 5       Patients Stated Pain Goal: 5 (03/09/17 1313) Immediate Anesthesia Transfer of Care Note  Patient: Berline Lopes  Procedure(s) Performed: Procedure(s) (LRB): CYSTOSCOPY WITH LEFT RETROGRADE PYELOGRAM, LEFT URETEROSCOPY, HOLMIUM LASER AND STENT PLACEMENT (Left)  Patient Location: PACU  Anesthesia Type: General  Level of Consciousness: awake, alert  and oriented  Airway & Oxygen Therapy: Patient Spontanous Breathing and Patient connected to nasal cannula oxygen  Post-op Assessment: Report given to PACU RN and Post -op Vital signs reviewed and stable  Post vital signs: Reviewed and stable  Complications: No apparent anesthesia complications

## 2017-03-12 ENCOUNTER — Encounter (HOSPITAL_BASED_OUTPATIENT_CLINIC_OR_DEPARTMENT_OTHER): Payer: Self-pay | Admitting: Urology

## 2017-03-12 NOTE — Op Note (Signed)
NAME:  Stephen Dalton, Stephen Dalton                   ACCOUNT NO.:  MEDICAL RECORD NO.:  [id number]  LOCATION:                                 FACILITY:  PHYSICIAN:  Marshall Cork. Jeffie Pollock, M.D.    DATE OF BIRTH:  02-09-1971  DATE OF PROCEDURE:  03/09/2017 DATE OF DISCHARGE:                              OPERATIVE REPORT   PROCEDURE:  Cystoscopy with left retrograde pyelogram and interpretation, left ureteroscopy with holmium lasertripsy and placement of left double-J stent.  PREOPERATIVE DIAGNOSIS:  A 7 mm left ureteropelvic junction stone.  POSTOPERATIVE DIAGNOSIS:  A 7 mm left renal stone.  SURGEON:  Marshall Cork. Jeffie Pollock, M.D.  ANESTHESIA:  General.  SPECIMEN:  Stone fragments.  DRAINS:  A 6-French 26 cm double-J stent without string.  BLOOD LOSS:  Minimal.  COMPLICATIONS:  None.  INDICATIONS:  Mr. Revolorio is a 46 year old white male who presented today with severe left flank pain that could not be controlled as he has a 7 mm stone at the left UPJ on CT.  It was felt that cystoscopy with stenting and possible ureteroscopy were indicated.  FINDINGS OF PROCEDURE:  He was given 2 g of Ancef.  He was taken to the operating room where general anesthetic was induced.  He was placed in lithotomy position and was fitted with PAS hose.  His perineum and genitalia were prepped with Betadine solution.  He was draped in usual sterile fashion.  A cystoscopy was performed using a 23-French scope and 30-degree lens. Examination revealed a normal urethra.  The external sphincter was intact.  The prostatic urethra was short without obstruction. Examination of the bladder revealed mild trabeculation.  No tumors, stones, or inflammation were noted.  The ureteral orifices were in the normal anatomic position.  Left ureteral orifice was cannulated with 5-French open-end catheter and contrast was instilled.  A left retrograde pyelogram revealed some fusiform dilation of the distal intramural ureter consistent  with a small ureterocele.  The ureter was otherwise normal caliber to the kidney.  There was a filling defect at the UPJ that flushed back to the kidney consistent with a stone with mild dilation.  A guidewire was then passed to the kidney and a 12, 14, 55 cm digital access sheath were then initially used to dilate the ureter with the inner core.  There were some resistance in the lower mid ureter, but eventually was able to get up to the kidney.  The sheath was then assembled and inserted once again with resistance in the same location, but passage to the kidney was possible.  The inner core and wire were then removed.  The dual-lumen digital flexible scope was then passed and the stone was visualized in the mid kidney.  The stone was then engaged with a 200-micron holmium laser fiber initially on 1 watt and 5 hertz, but the frequency was increased to 20 hertz after initial fragmentation.  The stone was broken into manageable fragments which were then removed with the N-gage basket.  Once all the fragments were removed, final inspection of all the calices revealed no residual significant stones.  At this point, the guidewire was replaced and the  ureteroscope was removed under direct vision.  There was a small mucosal tear in the mid ureteral area that was not full thickness, but it was felt a stent was indicated.  Once the sheath and ureteroscope were out, a cystoscope was reinserted over the wire and a 6-French 26 cm Contour double-J stent without string was passed to the kidney under fluoroscopic guidance.  The wire was removed leaving good coil in the kidney and good coil in the bladder. We will leave that in for at least a week.  The patient was then taken down from lithotomy position.  His anesthetic was reversed.  He was moved to the recovery room in stable condition. There were no complications.     Marshall Cork. Jeffie Pollock, M.D.     JJW/MEDQ  D:  03/09/2017  T:   03/09/2017  Job:  [id number]

## 2017-03-13 NOTE — Anesthesia Postprocedure Evaluation (Signed)
Anesthesia Post Note  Patient: Stephen Dalton  Procedure(s) Performed: Procedure(s) (LRB): CYSTOSCOPY WITH LEFT RETROGRADE PYELOGRAM, LEFT URETEROSCOPY, HOLMIUM LASER AND STENT PLACEMENT (Left)  Patient location during evaluation: PACU Anesthesia Type: General Level of consciousness: awake and alert Pain management: pain level controlled Vital Signs Assessment: post-procedure vital signs reviewed and stable Respiratory status: spontaneous breathing, nonlabored ventilation, respiratory function stable and patient connected to nasal cannula oxygen Cardiovascular status: blood pressure returned to baseline and stable Postop Assessment: no signs of nausea or vomiting Anesthetic complications: no       Last Vitals:  Vitals:   03/09/17 1515 03/09/17 1535  BP: 121/88 118/74  Pulse: (!) 59 61  Resp: 15 15  Temp:  36.6 C    Last Pain:  Vitals:   03/12/17 1455  TempSrc:   PainSc: 0-No pain                 Frona Yost EDWARD

## 2017-06-15 NOTE — Addendum Note (Signed)
Addendum  created 06/15/17 1100 by Lyndle Herrlich, MD   Sign clinical note

## 2017-06-15 NOTE — Anesthesia Postprocedure Evaluation (Signed)
Anesthesia Post Note  Patient: Arwin Bisceglia  Procedure(s) Performed: Procedure(s) (LRB): CYSTOSCOPY WITH LEFT RETROGRADE PYELOGRAM, LEFT URETEROSCOPY, HOLMIUM LASER AND STENT PLACEMENT (Left)     Anesthesia Post Evaluation  Last Vitals:  Vitals:   03/09/17 1515 03/09/17 1535  BP: 121/88 118/74  Pulse: (!) 59 61  Resp: 15 15  Temp:  36.6 C    Last Pain:  Vitals:   03/12/17 1455  TempSrc:   PainSc: 0-No pain                 Arek Spadafore EDWARD

## 2018-05-24 ENCOUNTER — Other Ambulatory Visit: Payer: Self-pay | Admitting: Family Medicine

## 2018-05-24 ENCOUNTER — Ambulatory Visit
Admission: RE | Admit: 2018-05-24 | Discharge: 2018-05-24 | Disposition: A | Discharge status: Home / Self Care | Payer: BLUE CROSS/BLUE SHIELD | Source: Ambulatory Visit | Attending: Family Medicine | Admitting: Family Medicine

## 2018-05-24 DIAGNOSIS — R053 Chronic cough: Secondary | ICD-10-CM

## 2018-05-24 DIAGNOSIS — R05 Cough: Secondary | ICD-10-CM

## 2022-05-16 ENCOUNTER — Encounter: Payer: Self-pay | Admitting: Gastroenterology

## 2022-05-23 ENCOUNTER — Ambulatory Visit (AMBULATORY_SURGERY_CENTER): Payer: BLUE CROSS/BLUE SHIELD | Admitting: *Deleted

## 2022-05-23 VITALS — Ht 71.0 in | Wt 230.0 lb

## 2022-05-23 DIAGNOSIS — Z1211 Encounter for screening for malignant neoplasm of colon: Secondary | ICD-10-CM

## 2022-05-23 NOTE — Progress Notes (Signed)
No egg or soy allergy known to patient  No issues known to pt with past sedation with any surgeries or procedures Patient denies ever being told they had issues or difficulty with intubation  No FH of Malignant Hyperthermia Pt is not on diet pills Pt is not on  home 02  Pt is not on blood thinners  Pt denies issues with constipation  No A fib or A flutter  PV completed in person. Pt verified name, DOB.  Procedure explained to pt. Prep instructions reviewed, questions answered. Pt encouraged to call with questions or issues.  If pt has My chart, procedure instructions sent via My Chart    

## 2022-06-15 ENCOUNTER — Encounter: Payer: Self-pay | Admitting: Gastroenterology

## 2022-06-19 ENCOUNTER — Encounter: Payer: Self-pay | Admitting: Gastroenterology

## 2022-06-19 ENCOUNTER — Ambulatory Visit (AMBULATORY_SURGERY_CENTER): Payer: 59 | Admitting: Gastroenterology

## 2022-06-19 VITALS — BP 120/80 | HR 71 | Temp 99.3°F | Resp 13 | Ht 71.0 in | Wt 230.0 lb

## 2022-06-19 DIAGNOSIS — K621 Rectal polyp: Secondary | ICD-10-CM

## 2022-06-19 DIAGNOSIS — Z1211 Encounter for screening for malignant neoplasm of colon: Secondary | ICD-10-CM

## 2022-06-19 DIAGNOSIS — D128 Benign neoplasm of rectum: Secondary | ICD-10-CM

## 2022-06-19 MED ORDER — SODIUM CHLORIDE 0.9 % IV SOLN: 500.0000 mL | INTRAVENOUS | Status: DC

## 2022-06-19 NOTE — Progress Notes (Signed)
[facility] History and Physical   Primary Care Physician:  [name], MD   Reason for Procedure:   Colon cancer screening  Plan:    colonoscopy     HPI: Stephen Dalton is a 51 y.o. male  here for colonoscopy screening - first time exam. Patient denies any bowel symptoms at this time. No family history of colon cancer known. Otherwise feels well without any cardiopulmonary symptoms.    Past Medical History:  Diagnosis Date   Asthma    Heart murmur    Hematuria    History of kidney stones    HLD (hyperlipidemia)    Left ureteral stone    Poison ivy    since 03-05-2017--- legs and arm   Seasonal allergies    Urgency of urination     Past Surgical History:  Procedure Laterality Date   APPENDECTOMY  2012   CYSTOSCOPY WITH RETROGRADE PYELOGRAM, URETEROSCOPY AND STENT PLACEMENT Left 03/09/2017   Procedure: CYSTOSCOPY WITH LEFT RETROGRADE PYELOGRAM, LEFT URETEROSCOPY, HOLMIUM LASER AND STENT PLACEMENT;  Surgeon: Irine Seal, MD;  Location: [facility];  Service: Urology;  Laterality: Left;   LAPAROSCOPIC APPENDECTOMY  05-29-2011   in [place]    Prior to Admission medications   Medication Sig Start Date End Date Taking? Authorizing Provider  FLUoxetine (PROZAC) 10 MG capsule Take 10 mg by mouth daily. 01/23/22  Yes [provider]  simvastatin (ZOCOR) 10 MG tablet Take 10 mg by mouth daily.   Yes [provider]  ALBUTEROL IN Inhale into the lungs as needed.    [provider]  ibuprofen (ADVIL,MOTRIN) 200 MG tablet Take 200 mg by mouth every 6 (six) hours as needed.    [provider]  Multiple Vitamin (MULTIVITAMIN) tablet Take 1 tablet by mouth daily. Patient not taking: Reported on 05/23/2022    [provider]  oxyCODONE-acetaminophen (ROXICET) 5-325 MG tablet Take 1 tablet by mouth every 4 (four) hours as needed for severe pain. Patient not taking: Reported on 05/23/2022 03/09/17   Irine Seal, MD   phenazopyridine (PYRIDIUM) 200 MG tablet Take 1 tablet (200 mg total) by mouth 3 (three) times daily as needed for pain. Patient not taking: Reported on 05/23/2022 03/09/17   Irine Seal, MD    Current Outpatient Medications  Medication Sig Dispense Refill   FLUoxetine (PROZAC) 10 MG capsule Take 10 mg by mouth daily.     simvastatin (ZOCOR) 10 MG tablet Take 10 mg by mouth daily.     ALBUTEROL IN Inhale into the lungs as needed.     ibuprofen (ADVIL,MOTRIN) 200 MG tablet Take 200 mg by mouth every 6 (six) hours as needed.     Multiple Vitamin (MULTIVITAMIN) tablet Take 1 tablet by mouth daily. (Patient not taking: Reported on 05/23/2022)     oxyCODONE-acetaminophen (ROXICET) 5-325 MG tablet Take 1 tablet by mouth every 4 (four) hours as needed for severe pain. (Patient not taking: Reported on 05/23/2022) 20 tablet 0   phenazopyridine (PYRIDIUM) 200 MG tablet Take 1 tablet (200 mg total) by mouth 3 (three) times daily as needed for pain. (Patient not taking: Reported on 05/23/2022) 15 tablet 0   Current Facility-Administered Medications  Medication Dose Route Frequency Provider Last Rate Last Admin   0.9 %  sodium chloride infusion  500 mL Intravenous Continuous Kenden Brandt, Carlota Raspberry, MD        Allergies as of 06/19/2022 - Review Complete 06/19/2022  Allergen Reaction Noted   Latex Rash 02/03/2016  Family History  Problem Relation Age of Onset   Heart attack Mother    Throat cancer Father    Colon cancer Neg Hx    Colon polyps Neg Hx    Esophageal cancer Neg Hx    Stomach cancer Neg Hx    Rectal cancer Neg Hx     Social History   Socioeconomic History   Marital status: Married    Spouse name: Not on file   Number of children: Not on file   Years of education: Not on file   Highest education level: Not on file  Occupational History   Not on file  Tobacco Use   Smoking status: Never   Smokeless tobacco: Never  Vaping Use   Vaping Use: Never used  Substance and Sexual  Activity   Alcohol use: Yes    Comment: rare   Drug use: No   Sexual activity: Not on file  Other Topics Concern   Not on file  Social History Narrative   Not on file   Social Determinants of Health   Financial Resource Strain: Not on file  Food Insecurity: Not on file  Transportation Needs: Not on file  Physical Activity: Not on file  Stress: Not on file  Social Connections: Not on file  Intimate Partner Violence: Not on file    Review of Systems: All other review of systems negative except as mentioned in the HPI.  Physical Exam: Vital signs BP (!) 152/104   Pulse 100   Temp 99.3 F (37.4 C) (Temporal)   Ht '5\' 11"'$  (1.803 m)   Wt 230 lb (104.3 kg)   SpO2 97%   BMI 32.08 kg/m   General:   Alert,  Well-developed, pleasant and cooperative in NAD Lungs:  Clear throughout to auscultation.   Heart:  Regular rate and rhythm Abdomen:  Soft, nontender and nondistended.   Neuro/Psych:  Alert and cooperative. Normal mood and affect. A and O x 3  Jolly Mango, MD [facility]

## 2022-06-19 NOTE — Op Note (Signed)
[facility] Patient Name: Stephen Dalton Procedure Date: 06/19/2022 4:03 PM MRN: [id number] Endoscopist: Remo Lipps P. Havery Moros , MD Age: 51 Referring MD:  Date of Birth: [birth year] Gender: Male Account #: [id number] Procedure:                Colonoscopy Indications:              Screening for colorectal malignant neoplasm, This                            is the patient's first colonoscopy Medicines:                Monitored Anesthesia Care Procedure:                Pre-Anesthesia Assessment:                           - Prior to the procedure, a History and Physical                            was performed, and patient medications and                            allergies were reviewed. The patient's tolerance of                            previous anesthesia was also reviewed. The risks                            and benefits of the procedure and the sedation                            options and risks were discussed with the patient.                            All questions were answered, and informed consent                            was obtained. Prior Anticoagulants: The patient has                            taken no previous anticoagulant or antiplatelet                            agents. ASA Grade Assessment: II - A patient with                            mild systemic disease. After reviewing the risks                            and benefits, the patient was deemed in                            satisfactory condition to undergo the procedure.  After obtaining informed consent, the colonoscope                            was passed under direct vision. Throughout the                            procedure, the patient's blood pressure, pulse, and                            oxygen saturations were monitored continuously. The                            CF [id number] #[id number] was introduced through the anus                            and advanced to the  the cecum, identified by                            appendiceal orifice and ileocecal valve. The                            colonoscopy was performed without difficulty. The                            patient tolerated the procedure well. The quality                            of the bowel preparation was good. The ileocecal                            valve, appendiceal orifice, and rectum were                            photographed. Scope In: 4:18:35 PM Scope Out: 4:33:11 PM Scope Withdrawal Time: 0 hours 12 minutes 44 seconds  Total Procedure Duration: 0 hours 14 minutes 36 seconds  Findings:                 The perianal and digital rectal examinations were                            normal.                           A 3 mm polyp was found in the rectum. The polyp was                            sessile. The polyp was removed with a cold snare.                            Resection and retrieval were complete.                           Multiple small-mouthed diverticula were found in  the sigmoid colon.                           Internal hemorrhoids were found.                           The exam was otherwise without abnormality. Complications:            No immediate complications. Estimated blood loss:                            Minimal. Estimated Blood Loss:     Estimated blood loss was minimal. Impression:               - One 3 mm polyp in the rectum, removed with a cold                            snare. Resected and retrieved.                           - Diverticulosis in the sigmoid colon.                           - Internal hemorrhoids.                           - The examination was otherwise normal. Recommendation:           - Patient has a contact number available for                            emergencies. The signs and symptoms of potential                            delayed complications were discussed with the                             patient. Return to normal activities tomorrow.                            Written discharge instructions were provided to the                            patient.                           - Resume previous diet.                           - Continue present medications.                           - Await pathology results. Remo Lipps P. [name], MD 06/19/2022 4:37:01 PM This report has been signed electronically.

## 2022-06-19 NOTE — Progress Notes (Signed)
Pt's states no medical or surgical changes since previsit or office visit. 

## 2022-06-19 NOTE — Patient Instructions (Signed)
  HANDOUTS ON DIVERTICULOSIS, POLYPS AND HEMORRHOIDS GIVEN.   YOU HAD AN ENDOSCOPIC PROCEDURE TODAY AT [facility]:   Refer to the procedure report that was given to you for any specific questions about what was found during the examination.  If the procedure report does not answer your questions, please call your gastroenterologist to clarify.  If you requested that your care partner not be given the details of your procedure findings, then the procedure report has been included in a sealed envelope for you to review at your convenience later.  YOU SHOULD EXPECT: Some feelings of bloating in the abdomen. Passage of more gas than usual.  Walking can help get rid of the air that was put into your GI tract during the procedure and reduce the bloating. If you had a lower endoscopy (such as a colonoscopy or flexible sigmoidoscopy) you may notice spotting of blood in your stool or on the toilet paper. If you underwent a bowel prep for your procedure, you may not have a normal bowel movement for a few days.  Please Note:  You might notice some irritation and congestion in your nose or some drainage.  This is from the oxygen used during your procedure.  There is no need for concern and it should clear up in a day or so.  SYMPTOMS TO REPORT IMMEDIATELY:  Following lower endoscopy (colonoscopy or flexible sigmoidoscopy):  Excessive amounts of blood in the stool  Significant tenderness or worsening of abdominal pains  Swelling of the abdomen that is new, acute  Fever of 100F or higher   For urgent or emergent issues, a gastroenterologist can be reached at any hour by calling [phone]. Do not use MyChart messaging for urgent concerns.    DIET:  We do recommend a small meal at first, but then you may proceed to your regular diet.  Drink plenty of fluids but you should avoid alcoholic beverages for 24 hours.  ACTIVITY:  You should plan to take it easy for the rest of today and you  should NOT DRIVE or use heavy machinery until tomorrow (because of the sedation medicines used during the test).    FOLLOW UP: Our staff will call the number listed on your records the next business day following your procedure.  We will call around 7:15- 8:00 am to check on you and address any questions or concerns that you may have regarding the information given to you following your procedure. If we do not reach you, we will leave a message.  If you develop any symptoms (ie: fever, flu-like symptoms, shortness of breath, cough etc.) before then, please call [phone].  If you test positive for Covid 19 in the 2 weeks post procedure, please call and report this information to [place].    If any biopsies were taken you will be contacted by phone or by letter within the next 1-3 weeks.  Please call us at [phone] if you have not heard about the biopsies in 3 weeks.    SIGNATURES/CONFIDENTIALITY: You and/or your care partner have signed paperwork which will be entered into your electronic medical record.  These signatures attest to the fact that that the information above on your After Visit Summary has been reviewed and is understood.  Full responsibility of the confidentiality of this discharge information lies with you and/or your care-partner.

## 2022-06-19 NOTE — Progress Notes (Signed)
Called to room to assist during endoscopic procedure.  Patient ID and intended procedure confirmed with present staff. Received instructions for my participation in the procedure from the performing physician.  

## 2022-06-19 NOTE — Progress Notes (Signed)
Vss nad trans to pacu °

## 2022-06-20 ENCOUNTER — Telehealth: Payer: Self-pay

## 2022-06-20 NOTE — Telephone Encounter (Signed)
  Follow up Call-     06/19/2022    3:10 PM  Call back number  Post procedure Call Back phone  # [phone]  Permission to leave phone message Yes     Patient questions:  Do you have a fever, pain , or abdominal swelling? No. Pain Score  0 *  Have you tolerated food without any problems? Yes.    Have you been able to return to your normal activities? Yes.    Do you have any questions about your discharge instructions: Diet   No. Medications  No. Follow up visit  No.  Do you have questions or concerns about your Care? No.  Actions: * If pain score is 4 or above: No action needed, pain <4.

## 2022-06-25 ENCOUNTER — Encounter: Payer: Self-pay | Admitting: Gastroenterology

## 2024-04-15 ENCOUNTER — Ambulatory Visit (HOSPITAL_COMMUNITY)
Admission: RE | Admit: 2024-04-15 | Discharge: 2024-04-15 | Disposition: A | Discharge status: Home / Self Care | Attending: Urology | Admitting: Urology

## 2024-04-15 ENCOUNTER — Inpatient Hospital Stay (HOSPITAL_BASED_OUTPATIENT_CLINIC_OR_DEPARTMENT_OTHER): Admitting: Anesthesiology

## 2024-04-15 ENCOUNTER — Encounter (HOSPITAL_COMMUNITY): Payer: Self-pay | Admitting: Urology

## 2024-04-15 ENCOUNTER — Other Ambulatory Visit: Payer: Self-pay | Admitting: Urology

## 2024-04-15 ENCOUNTER — Encounter (HOSPITAL_COMMUNITY)
Admission: RE | Disposition: A | Discharge status: Home / Self Care | Payer: Self-pay | Source: Home / Self Care | Attending: Urology

## 2024-04-15 ENCOUNTER — Inpatient Hospital Stay (HOSPITAL_COMMUNITY)

## 2024-04-15 ENCOUNTER — Inpatient Hospital Stay (HOSPITAL_COMMUNITY): Admitting: Anesthesiology

## 2024-04-15 DIAGNOSIS — J45909 Unspecified asthma, uncomplicated: Secondary | ICD-10-CM | POA: Insufficient documentation

## 2024-04-15 DIAGNOSIS — N202 Calculus of kidney with calculus of ureter: Secondary | ICD-10-CM

## 2024-04-15 DIAGNOSIS — Z87442 Personal history of urinary calculi: Secondary | ICD-10-CM | POA: Insufficient documentation

## 2024-04-15 HISTORY — PX: CYSTOSCOPY/URETEROSCOPY/HOLMIUM LASER/STENT PLACEMENT: SHX6546

## 2024-04-15 LAB — BASIC METABOLIC PANEL WITH GFR
Anion gap: 12 (ref 5–15)
BUN: 19 mg/dL (ref 6–20)
CO2: 20 mmol/L — ABNORMAL LOW (ref 22–32)
Calcium: 9.6 mg/dL (ref 8.9–10.3)
Chloride: 105 mmol/L (ref 98–111)
Creatinine, Ser: 1.05 mg/dL (ref 0.61–1.24)
GFR, Estimated: >60 mL/min (ref >60)
Glucose, Bld: 132 mg/dL — ABNORMAL HIGH (ref 70–99)
Potassium: 4.3 mmol/L (ref 3.5–5.1)
Sodium: 137 mmol/L (ref 135–145)

## 2024-04-15 SURGERY — CYSTOSCOPY/URETEROSCOPY/HOLMIUM LASER/STENT PLACEMENT
Anesthesia: General | Laterality: Right

## 2024-04-15 MED ORDER — GENTAMICIN SULFATE 40 MG/ML IJ SOLN
5.0000 mg/kg | INTRAVENOUS | Status: AC
  Administered 2024-04-15: 510.4 mg via INTRAVENOUS
  Filled 2024-04-15: qty 12.75

## 2024-04-15 MED ORDER — MIDAZOLAM HCL 2 MG/2ML IJ SOLN
INTRAMUSCULAR | Status: AC
  Filled 2024-04-15: qty 2

## 2024-04-15 MED ORDER — LACTATED RINGERS IV SOLN: INTRAVENOUS | Status: DC | PRN

## 2024-04-15 MED ORDER — CEPHALEXIN 500 MG PO CAPS: 500.0000 mg | ORAL_CAPSULE | Freq: Two times a day (BID) | ORAL | 0 refills | Status: AC

## 2024-04-15 MED ORDER — DEXAMETHASONE SODIUM PHOSPHATE 10 MG/ML IJ SOLN
INTRAMUSCULAR | Status: DC | PRN
  Administered 2024-04-15: 10 mg via INTRAVENOUS

## 2024-04-15 MED ORDER — OXYCODONE HCL 5 MG/5ML PO SOLN: 5.0000 mg | Freq: Once | ORAL | Status: DC | PRN

## 2024-04-15 MED ORDER — FENTANYL CITRATE PF 50 MCG/ML IJ SOSY: 25.0000 ug | PREFILLED_SYRINGE | INTRAMUSCULAR | Status: DC | PRN

## 2024-04-15 MED ORDER — SODIUM CHLORIDE 0.9 % IR SOLN
Status: DC | PRN
  Administered 2024-04-15: 3000 mL

## 2024-04-15 MED ORDER — KETOROLAC TROMETHAMINE 10 MG PO TABS: 10.0000 mg | ORAL_TABLET | Freq: Three times a day (TID) | ORAL | 0 refills | Status: AC | PRN

## 2024-04-15 MED ORDER — SENNOSIDES-DOCUSATE SODIUM 8.6-50 MG PO TABS: 1.0000 | ORAL_TABLET | Freq: Two times a day (BID) | ORAL | 0 refills | Status: AC

## 2024-04-15 MED ORDER — FENTANYL CITRATE (PF) 100 MCG/2ML IJ SOLN
INTRAMUSCULAR | Status: AC
  Filled 2024-04-15: qty 2

## 2024-04-15 MED ORDER — PROPOFOL 10 MG/ML IV BOLUS
INTRAVENOUS | Status: DC | PRN
  Administered 2024-04-15: 200 mg via INTRAVENOUS

## 2024-04-15 MED ORDER — OXYCODONE HCL 5 MG PO TABS: 5.0000 mg | ORAL_TABLET | Freq: Once | ORAL | Status: DC | PRN

## 2024-04-15 MED ORDER — ACETAMINOPHEN 500 MG PO TABS
1000.0000 mg | ORAL_TABLET | Freq: Once | ORAL | Status: AC
  Administered 2024-04-15: 1000 mg via ORAL
  Filled 2024-04-15: qty 2

## 2024-04-15 MED ORDER — IOHEXOL 300 MG/ML  SOLN
INTRAMUSCULAR | Status: DC | PRN
  Administered 2024-04-15: 18 mL

## 2024-04-15 MED ORDER — MIDAZOLAM HCL 2 MG/2ML IJ SOLN
INTRAMUSCULAR | Status: DC | PRN
  Administered 2024-04-15: 2 mg via INTRAVENOUS

## 2024-04-15 MED ORDER — LIDOCAINE HCL (PF) 2 % IJ SOLN
INTRAMUSCULAR | Status: DC | PRN
  Administered 2024-04-15: 100 mg via INTRADERMAL

## 2024-04-15 MED ORDER — OXYCODONE-ACETAMINOPHEN 5-325 MG PO TABS: 1.0000 | ORAL_TABLET | Freq: Four times a day (QID) | ORAL | 0 refills | Status: AC | PRN

## 2024-04-15 MED ORDER — AMISULPRIDE (ANTIEMETIC) 5 MG/2ML IV SOLN: 10.0000 mg | Freq: Once | INTRAVENOUS | Status: DC | PRN

## 2024-04-15 MED ORDER — ONDANSETRON HCL 4 MG/2ML IJ SOLN
INTRAMUSCULAR | Status: DC | PRN
  Administered 2024-04-15: 4 mg via INTRAVENOUS

## 2024-04-15 MED ORDER — FENTANYL CITRATE (PF) 250 MCG/5ML IJ SOLN: INTRAMUSCULAR | Status: DC | PRN

## 2024-04-15 MED ORDER — FENTANYL CITRATE (PF) 100 MCG/2ML IJ SOLN
INTRAMUSCULAR | Status: DC | PRN
  Administered 2024-04-15 (×2): 50 ug via INTRAVENOUS

## 2024-04-15 MED ORDER — PHENYLEPHRINE 80 MCG/ML (10ML) SYRINGE FOR IV PUSH (FOR BLOOD PRESSURE SUPPORT)
PREFILLED_SYRINGE | INTRAVENOUS | Status: DC | PRN
  Administered 2024-04-15: 160 ug via INTRAVENOUS

## 2024-04-15 MED ORDER — LACTATED RINGERS IV SOLN: INTRAVENOUS | Status: DC

## 2024-04-15 MED ORDER — CHLORHEXIDINE GLUCONATE 0.12 % MT SOLN
15.0000 mL | Freq: Once | OROMUCOSAL | Status: AC
  Administered 2024-04-15: 15 mL via OROMUCOSAL

## 2024-04-15 SURGICAL SUPPLY — 19 items
BAG URO CATCHER STRL LF (MISCELLANEOUS) ×1 IMPLANT
BASKET LASER NITINOL 1.9FR (BASKET) IMPLANT
CATH URETL OPEN END 6FR 70 (CATHETERS) ×1 IMPLANT
CLOTH BEACON ORANGE TIMEOUT ST (SAFETY) ×1 IMPLANT
DRSG IV TEGADERM 3.5X4.5 STRL (GAUZE/BANDAGES/DRESSINGS) IMPLANT
GLOVE SURG LX STRL 7.5 STRW (GLOVE) ×1 IMPLANT
GOWN STRL REUS W/ TWL XL LVL3 (GOWN DISPOSABLE) ×1 IMPLANT
GUIDEWIRE ANG ZIPWIRE 038X150 (WIRE) ×1 IMPLANT
GUIDEWIRE STR DUAL SENSOR (WIRE) ×1 IMPLANT
KIT TURNOVER KIT A (KITS) IMPLANT
MANIFOLD NEPTUNE II (INSTRUMENTS) ×1 IMPLANT
PACK CYSTO (CUSTOM PROCEDURE TRAY) ×1 IMPLANT
SHEATH NAVIGATOR HD 11/13X28 (SHEATH) IMPLANT
SHEATH NAVIGATOR HD 11/13X36 (SHEATH) IMPLANT
STENT POLARIS 5FRX26 (STENTS) IMPLANT
TRACTIP FLEXIVA PULS ID 200XHI (Laser) IMPLANT
TUBE PU 8FR 16IN ENFIT (TUBING) ×1 IMPLANT
TUBING CONNECTING 10 (TUBING) ×1 IMPLANT
TUBING UROLOGY SET (TUBING) ×1 IMPLANT

## 2024-04-15 NOTE — Brief Op Note (Signed)
 04/15/2024  5:35 PM  PATIENT:  Stephen Dalton  53 y.o. male  PRE-OPERATIVE DIAGNOSIS:  RIGHT URETERL/RENAL STONE  POST-OPERATIVE DIAGNOSIS:  RIGHT URETERL/RENAL STONE  PROCEDURE:  Procedure(s): CYSTOSCOPY/URETEROSCOPY/STENT PLACEMENT (Right)  SURGEON:  Surgeons and Role:    * Manny, Harvey Linen., MD - Primary  PHYSICIAN ASSISTANT:   ASSISTANTS: none   ANESTHESIA:   general  EBL:  minimal   BLOOD ADMINISTERED:none  DRAINS: none   LOCAL MEDICATIONS USED:  NONE  SPECIMEN:  Source of Specimen:  Rt ureteral and renal stones  DISPOSITION OF SPECIMEN:  [facility]  COUNTS:  YES  TOURNIQUET:  * No tourniquets in log *  DICTATION: .Other Dictation: Dictation Number [id number]  PLAN OF CARE: Discharge to home after PACU  PATIENT DISPOSITION:  PACU - hemodynamically stable.   Delay start of Pharmacological VTE agent (>24hrs) due to surgical blood loss or risk of bleeding: yes

## 2024-04-15 NOTE — Discharge Instructions (Addendum)
 1 - You may have urinary urgency (bladder spasms) and bloody urine on / off with stent in place. This is normal.  2 - Remove tethered stent on Thursday morning at home by pulling on string, then blue-white plastic tubing, and discarding. Office is open Thursday if any issues arise.   3 - Call MD or go to ER for fever >102, severe pain / nausea / vomiting not relieved by medications, or acute change in medical status

## 2024-04-15 NOTE — Transfer of Care (Signed)
 Immediate Anesthesia Transfer of Care Note  Patient: [name]  Procedure(s) Performed: CYSTOSCOPY/URETEROSCOPY/STENT PLACEMENT (Right)  Patient Location: PACU  Anesthesia Type:General  Level of Consciousness: drowsy  Airway & Oxygen Therapy: Patient Spontanous Breathing and Patient connected to face mask oxygen  Post-op Assessment: Report given to RN and Post -op Vital signs reviewed and stable  Post vital signs: Reviewed and stable  Last Vitals:  Vitals Value Taken Time  BP 101/58 04/15/24 1740  Temp    Pulse 93 04/15/24 1742  Resp 11 04/15/24 1742  SpO2 95 % 04/15/24 1742  Vitals shown include unfiled device data.  Last Pain:  Vitals:   04/15/24 1510  TempSrc: Oral  PainSc: 5       Patients Stated Pain Goal: 3 (04/15/24 1510)  Complications: No notable events documented.

## 2024-04-15 NOTE — Anesthesia Procedure Notes (Signed)
 Procedure Name: LMA Insertion Date/Time: 04/15/2024 5:04 PM  Performed by: Josetta Niece, CRNAPre-anesthesia Checklist: Patient identified, Emergency Drugs available, Suction available and Patient being monitored Patient Re-evaluated:Patient Re-evaluated prior to induction Oxygen Delivery Method: Circle System Utilized Preoxygenation: Pre-oxygenation with 100% oxygen Induction Type: IV induction Ventilation: Mask ventilation without difficulty LMA: LMA inserted LMA Size: 4.0 Number of attempts: 1 Placement Confirmation: positive ETCO2 Tube secured with: Tape Dental Injury: Teeth and Oropharynx as per pre-operative assessment

## 2024-04-15 NOTE — Anesthesia Preprocedure Evaluation (Addendum)
 Anesthesia Evaluation  Patient identified by MRN, date of birth, ID band Patient awake    Reviewed: Allergy & Precautions, NPO status , Patient's Chart, lab work & pertinent test results  History of Anesthesia Complications Negative for: history of anesthetic complications  Airway Mallampati: III  TM Distance: >3 FB Neck ROM: Full    Dental  (+) Dental Advisory Given   Pulmonary neg shortness of breath, asthma (last needed inhaler 1.5 months ago) , neg sleep apnea, neg COPD, neg recent URI   Pulmonary exam normal breath sounds clear to auscultation       Cardiovascular (-) hypertension(-) angina (-) Past MI, (-) Cardiac Stents and (-) CABG (-) dysrhythmias + Valvular Problems/Murmurs (childhood)  Rhythm:Regular Rate:Normal  HLD   Neuro/Psych negative neurological ROS     GI/Hepatic negative GI ROS, Neg liver ROS,,,  Endo/Other  negative endocrine ROS    Renal/GU Renal disease (right ureteral stone)     Musculoskeletal   Abdominal  (+) + obese  Peds  Hematology negative hematology ROS (+)   Anesthesia Other Findings   Reproductive/Obstetrics                             Anesthesia Physical Anesthesia Plan  ASA: 2  Anesthesia Plan: General   Post-op Pain Management: Tylenol  PO (pre-op)*   Induction: Intravenous  PONV Risk Score and Plan: 2 and Ondansetron , Dexamethasone  and Treatment may vary due to age or medical condition  Airway Management Planned: LMA  Additional Equipment:   Intra-op Plan:   Post-operative Plan: Extubation in OR  Informed Consent: I have reviewed the patients History and Physical, chart, labs and discussed the procedure including the risks, benefits and alternatives for the proposed anesthesia with the patient or authorized representative who has indicated his/her understanding and acceptance.     Dental advisory given  Plan Discussed with: CRNA and  Anesthesiologist  Anesthesia Plan Comments: (Risks of general anesthesia discussed including, but not limited to, sore throat, hoarse voice, chipped/damaged teeth, injury to vocal cords, nausea and vomiting, allergic reactions, lung infection, heart attack, stroke, and death. All questions answered. )        Anesthesia Quick Evaluation

## 2024-04-15 NOTE — H&P (Signed)
 Stephen Dalton is an 53 y.o. male.    Chief Complaint: Pre-Op RIGHT Ureteroscopic Stone Manipulation  HPI:   1 - Recurrent Urolithiasis -  Pre 2018 - MET x several  2018 - Left URS for proximal stone to stone free Stephen Dalton)  04/2024 CT Rt 2mm distal stone + 3mm renal x 2 and left pap tip calc.   PMH sig for lap appy, HLD. NO CV disease / blood thinners. Works at [employer]. His PCP is Dr. Courtland Ditch at [facility].   Today " Stephen Dalton" is seen as on -call work in for CT and r/o ureteral stone. He has Rt distal stone + renal stones and severe colic. UA without infectious parameters. Last meal yesterday.    Past Medical History:  Diagnosis Date   Asthma    Heart murmur    Hematuria    History of kidney stones    HLD (hyperlipidemia)    Left ureteral stone    Poison ivy    since 03-05-2017--- legs and arm   Seasonal allergies    Urgency of urination     Past Surgical History:  Procedure Laterality Date   APPENDECTOMY  2012   CYSTOSCOPY WITH RETROGRADE PYELOGRAM, URETEROSCOPY AND STENT PLACEMENT Left 03/09/2017   Procedure: CYSTOSCOPY WITH LEFT RETROGRADE PYELOGRAM, LEFT URETEROSCOPY, HOLMIUM LASER AND STENT PLACEMENT;  Surgeon: Homero Luster, MD;  Location: [facility];  Service: Urology;  Laterality: Left;   LAPAROSCOPIC APPENDECTOMY  05-29-2011   in [place]     Family History  Problem Relation Age of Onset   Heart attack Mother    Throat cancer Father    Colon cancer Neg Hx    Colon polyps Neg Hx    Esophageal cancer Neg Hx    Stomach cancer Neg Hx    Rectal cancer Neg Hx    Social History:  reports that he has never smoked. He has never used smokeless tobacco. He reports current alcohol use. He reports that he does not use drugs.  Allergies:  Allergies  Allergen Reactions   Latex Rash    No medications prior to admission.    No results found for this or any previous visit (from the past 48 hours). No results found.  Review of Systems  Constitutional:   Negative for chills and fever.  Gastrointestinal:  Positive for nausea.  Genitourinary:  Positive for flank pain.  All other systems reviewed and are negative.   There were no vitals taken for this visit. Physical Exam Vitals reviewed.  Constitutional:      Comments: Stigmata of sever colic  HENT:     Head: Normocephalic.     Mouth/Throat:     Mouth: Mucous membranes are moist.  Eyes:     Pupils: Pupils are equal, round, and reactive to light.  Cardiovascular:     Rate and Rhythm: Normal rate.  Pulmonary:     Effort: Pulmonary effort is normal.  Abdominal:     General: Abdomen is flat.  Genitourinary:    Comments: Sitnificant Rt CVAT Musculoskeletal:        General: Normal range of motion.     Cervical back: Normal range of motion.  Skin:    General: Skin is warm.  Neurological:     General: No focal deficit present.  Psychiatric:        Mood and Affect: Mood normal.      Assessment/Plan  Proceed as planned with cysto, RIGHT retrograde / ureteroscopy / laster / stent for multifocal Rt ureteral /  renal stoens. Risks, benefits, alternatives, expected peri-op course discussed.   Melody Spurling., MD 04/15/2024, 2:40 PM

## 2024-04-15 NOTE — Anesthesia Postprocedure Evaluation (Signed)
 Anesthesia Post Note  Patient: Stephen Dalton  Procedure(s) Performed: CYSTOSCOPY/URETEROSCOPY/STENT PLACEMENT (Right)     Patient location during evaluation: PACU Anesthesia Type: General Level of consciousness: awake Pain management: pain level controlled Vital Signs Assessment: post-procedure vital signs reviewed and stable Respiratory status: spontaneous breathing, nonlabored ventilation and respiratory function stable Cardiovascular status: blood pressure returned to baseline and stable Postop Assessment: no apparent nausea or vomiting Anesthetic complications: no   No notable events documented.  Last Vitals:  Vitals:   04/15/24 1745 04/15/24 1800  BP: 106/68 129/79  Pulse: 93 90  Resp: 11 11  Temp:    SpO2: 96% 97%    Last Pain:  Vitals:   04/15/24 1800  TempSrc:   PainSc: 0-No pain                 Conard Decent

## 2024-04-16 ENCOUNTER — Encounter (HOSPITAL_COMMUNITY): Payer: Self-pay | Admitting: Urology

## 2024-04-16 NOTE — Op Note (Unsigned)
 NAME: JAXX, TLATELPA MEDICAL RECORD NO: [id number] ACCOUNT NO: [id number] DATE OF BIRTH: 23-Jun-1971 FACILITY: [facility] LOCATION: [facility] PHYSICIAN: Osborn Blaze, MD  Operative Report   DATE OF PROCEDURE: 04/15/2024  PREOPERATIVE DIAGNOSIS:  Right ureteral and renal stones.  PROCEDURE PERFORMED:  1.  Cystoscopy with right retrograde pyelograms with interpretation. 2.  Right uteroscopy with basketing of stones. 3.  Placement of right ureteral stent.  ESTIMATED BLOOD LOSS:  Nil.  COMPLICATIONS:  None.  SPECIMENS:  Right ureteral renal stone for composition analysis.  FINDINGS:   1.  Retrograde positioning of right ureteral stone to the level of the kidney. 2.  Multifocal renal stones. 3.  Resolution of all accessible stone fragments larger than 1-3 mm following basket extraction. 4.  Successful placement of right ureteral stent, proximal end in renal pelvis and distal end in urinary bladder with tether.  INDICATIONS:  The patient is a pleasant 53 year old [employer] with a history of recurrent urolithiasis.  He was found on workup of excruciating flank pain to have a right distal ureteral stone and multifocal renal stones.  This colic was incredibly  difficult to control.  Options were discussed including medical therapy versus shock wave lithotripsy versus ureteroscopy and we agreed on ureteroscopy with the goal of right-sided stone-free.  He presents for this now. Informed consent was obtained and  placed in the medical record.  DESCRIPTION OF PROCEDURE:  The patient being Estal Gelo verified and the procedure being right ureteroscopic stone manipulation were confirmed.  Procedure timeout was performed.  Intravenous antibiotics administered.  General LMA anesthesia was  induced.  The patient was placed into a low lithotomy position.  A sterile field was created, prepped and draped the patient's penis, perineum and proximal thighs using iodine.  A cystourethroscopy was performed using  21-French rigid cystoscope with  offset lens.  Inspection of the patient's anterior and posterior urethra were unremarkable.  Inspection of the urinary bladder revealed no diverticula, calcifications, or palpable lesions.  The right ureteral orifice was cannulated with a 6-French  end-hole catheter and a right retrograde pyelogram was obtained.  Right retrograde pyelogram demonstrated a singe right ureter and a single system right kidney.  There was mild hydronephrosis noted. There were no obvious filling defects.  A 0.035 Zipwire was advanced to the level of the renal pole and set aside as a  safety wire.  An 8-French feeding tube was placed in the urinary bladder for pressure release, and semirigid ureteroscopy was performed in the distal orifice of the right ureter alongside the separate sensor working wire.  No mucosal abnormalities were  found or obvious intraluminal stone.  It was thought that this likely represented retrograde positioning of the prior right ureteral stone. The semirigid scope was then exchanged for a medium-length ureteral access sheath to the level of the proximal  ureter using continuous fluoroscopic guidance, and flexible digital ureteroscopy was performed in the proximal right ureter.  Systematic inspection was of the right kidney, including all calices x 3.  As anticipated, there were multifocal renal stones:   one lower pole, one mid pole, and then one upper pole.  These all appeared just amenable to simple basketing.  They were sequentially grasped in the long access with the escape basket, removed, and set aside for composition analysis.  Following this,  complete resolution of all accessible stones larger than 1-3 mm within the kidney and ureter.  The access sheath was removed under continuous vision.  No significant mucosal abnormalities were found.  Given  the patient's impressive colic before, it was  felt that brief interval stenting with tether stent would be most  prudent, and a new 5 x 26 cm Polaris type stent was carefully placed with the remaining safety wire using fluoroscopic guidance.  Good proximal and distal planes were noted.  The tether  was left in place, fashioned to the dorsum and penis, and the procedure was terminated.  The patient tolerated the procedure well.  No immediate periprocedural complications.  The patient was taken to postanesthesia care in stable condition.  Plan to  discharge home.   MUK D: 04/15/2024 5:40:04 pm T: 04/16/2024 4:06:00 am  JOB: [id number]/ [id number]
